# Patient Record
Sex: Male | Born: 1990 | Race: White | Hispanic: No | Marital: Single | Smoking: Current every day smoker
Health system: Southern US, Community
[De-identification: ages and names within clinical notes are randomized; demographics above are authoritative.]

## PROBLEM LIST (undated history)

## (undated) DIAGNOSIS — G8929 Other chronic pain: Secondary | ICD-10-CM

## (undated) DIAGNOSIS — J40 Bronchitis, not specified as acute or chronic: Secondary | ICD-10-CM

## (undated) DIAGNOSIS — M549 Dorsalgia, unspecified: Secondary | ICD-10-CM

## (undated) DIAGNOSIS — M51369 Other intervertebral disc degeneration, lumbar region without mention of lumbar back pain or lower extremity pain: Secondary | ICD-10-CM

## (undated) DIAGNOSIS — M5136 Other intervertebral disc degeneration, lumbar region: Secondary | ICD-10-CM

## (undated) DIAGNOSIS — J069 Acute upper respiratory infection, unspecified: Secondary | ICD-10-CM

## (undated) HISTORY — PX: EYE SURGERY: SHX253

---
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
## 2010-05-06 ENCOUNTER — Emergency Department (HOSPITAL_COMMUNITY)
Admission: EM | Admit: 2010-05-06 | Discharge: 2010-05-06 | Payer: Self-pay | Source: Home / Self Care | Admitting: Emergency Medicine

## 2010-07-18 ENCOUNTER — Emergency Department (HOSPITAL_COMMUNITY)
Admission: EM | Admit: 2010-07-18 | Discharge: 2010-07-18 | Disposition: A | Discharge status: Home / Self Care | Payer: Self-pay | Attending: Emergency Medicine | Admitting: Emergency Medicine

## 2010-07-18 DIAGNOSIS — R059 Cough, unspecified: Secondary | ICD-10-CM | POA: Insufficient documentation

## 2010-07-18 DIAGNOSIS — R05 Cough: Secondary | ICD-10-CM | POA: Insufficient documentation

## 2010-07-18 DIAGNOSIS — J069 Acute upper respiratory infection, unspecified: Secondary | ICD-10-CM | POA: Insufficient documentation

## 2010-07-18 DIAGNOSIS — J40 Bronchitis, not specified as acute or chronic: Secondary | ICD-10-CM | POA: Insufficient documentation
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
## 2010-09-24 NOTE — Op Note (Signed)
NAMEROEY, COOPMAN                            ACCOUNT NO.:  [id number]   MEDICAL RECORD NO.:  [id number]                   PATIENT TYPE:  INP   LOCATION:  5039                                 FACILITY:  [facility]   PHYSICIAN:  Blake Divine., M.D.              DATE OF BIRTH:  07-18-1990   DATE OF PROCEDURE:  08/08/2003  DATE OF DISCHARGE:                                 OPERATIVE REPORT   PREOPERATIVE DIAGNOSIS:  Ruptured globe, right eye.   POSTOPERATIVE DIAGNOSIS:  Ruptured globe, right eye.   PROCEDURE:  The patient was given IM Rocephin, was examined in the emergency  room and transported to the operating room where after induction of general  anesthesia, his face was prepped and draped in the usual sterile fashion  with the eye lashes of the right eye lid being trimmed.  It was noted that  the right eye lid had a deep laceration 1 mm from the limbus that did not  extent full-thickness and a more superficial laceration approximately 5 mm  on the right upper eye lid.  The patient's eye had a hyphema and there was  laceration of the temporal conjunctiva with brown tissue present.  A 6-0  silk suture was passed through the tarsus to retract the upper and lower  lids, keeping no pressure on the globe and then using the Bishop-Harmon  forceps and the Westcott scissors, a 300 degree peritomy was performed,  starting superiorly.  The eye was very soft, making the dissection  difficult.  The dissection was carried around to the temporal position at  the 9 o'clock position.  In this location, it was noted that there was a  scleral laceration that extended 5.5 mm temporally from the limbus.  A 6-0  Mersilene suture was placed and the wound edge was carefully debrided using  at [facility] sponge and Vannas scissors, being careful to retain as much tissue  as possible. Two specimens of uveal tissue were sent to pathology.  An iris  spatula was used to re-insert part of the uveal tissue that was  present.  After all uveal tissue was out of the eye, a total of four interrupted  Mersilene sutures on a spatula needle were placed to give complete closure  of the sclera.  Following this, a paracentesis track was formed through  clear cornea using a 5100 blade.  Provisc was injected in the eye to deepen  the anterior chamber.  After the Provisc had been injected in the eye, the  wound was again re-inspected.  There was an extension of the incision that  required an additional suture but the total sutures were as mentioned.  Following this, BSS was injected in the anterior chamber, the anterior  chamber remained deep.  The eye had a pressure that by palpation felt to be  in the mid teens.  Following this, a cryo unit was used  going down to a  minus 55 degrees, allowing the cryo to stay on the eye on a freeze soft  freeze over the area of vitreous and uveal prolapse that had been present.  Three cryo spots were placed.  Following this, a 6-0 Vicryl suture was used  to re-oppose the conjunctiva.  A subconjunctival injection of gentamycin was  given.  1 g of Ancef was given IV.  Following this, TobraDex ointment was  applied to the eye.  Following that, two sutures were used to close the skin  incisions using a 6-0 silk.  Following this, TobraDex was applied to the eye  and to the skin and a patch and fox shield were placed.  The patient was  then extubated and returned to the recovery area in stable condition.                                               Blake Divine., M.D.    RW/MEDQ  D:  08/08/2003  T:  08/08/2003  Job:  [id number]

## 2011-01-19 ENCOUNTER — Emergency Department (HOSPITAL_COMMUNITY)
Admission: EM | Admit: 2011-01-19 | Discharge: 2011-01-19 | Disposition: A | Discharge status: Home / Self Care | Payer: Self-pay | Attending: Emergency Medicine | Admitting: Emergency Medicine

## 2011-01-19 DIAGNOSIS — R110 Nausea: Secondary | ICD-10-CM | POA: Insufficient documentation

## 2011-01-19 DIAGNOSIS — B9789 Other viral agents as the cause of diseases classified elsewhere: Secondary | ICD-10-CM | POA: Insufficient documentation

## 2011-01-19 LAB — POCT I-STAT, CHEM 8
Calcium, Ion: 1.24 mmol/L (ref 1.12–1.32)
Chloride: 107 mEq/L (ref 96–112)
Glucose, Bld: 103 mg/dL — ABNORMAL HIGH (ref 70–99)
HCT: 46 % (ref 39.0–52.0)
Hemoglobin: 15.6 g/dL (ref 13.0–17.0)
Potassium: 4.4 mEq/L (ref 3.5–5.1)

## 2011-01-19 LAB — URINALYSIS, ROUTINE W REFLEX MICROSCOPIC
Bilirubin Urine: NEGATIVE
Hgb urine dipstick: NEGATIVE
Nitrite: NEGATIVE
Protein, ur: NEGATIVE mg/dL
Urobilinogen, UA: 0.2 mg/dL (ref 0.0–1.0)

## 2011-01-19 LAB — MONONUCLEOSIS SCREEN: Mono Screen: NEGATIVE

## 2011-01-28 ENCOUNTER — Emergency Department (HOSPITAL_COMMUNITY)
Admission: EM | Admit: 2011-01-28 | Discharge: 2011-01-29 | Discharge status: Against Medical Advice | Payer: Self-pay | Attending: Emergency Medicine | Admitting: Emergency Medicine

## 2011-03-14 ENCOUNTER — Emergency Department (HOSPITAL_COMMUNITY): Payer: Self-pay

## 2011-03-14 ENCOUNTER — Encounter: Payer: Self-pay | Admitting: *Deleted

## 2011-03-14 ENCOUNTER — Emergency Department (HOSPITAL_COMMUNITY)
Admission: EM | Admit: 2011-03-14 | Discharge: 2011-03-14 | Disposition: A | Discharge status: Home / Self Care | Payer: Self-pay | Attending: Emergency Medicine | Admitting: Emergency Medicine

## 2011-03-14 DIAGNOSIS — R011 Cardiac murmur, unspecified: Secondary | ICD-10-CM | POA: Insufficient documentation

## 2011-03-14 DIAGNOSIS — R05 Cough: Secondary | ICD-10-CM | POA: Insufficient documentation

## 2011-03-14 DIAGNOSIS — R059 Cough, unspecified: Secondary | ICD-10-CM | POA: Insufficient documentation

## 2011-03-14 DIAGNOSIS — J40 Bronchitis, not specified as acute or chronic: Secondary | ICD-10-CM | POA: Insufficient documentation

## 2011-03-14 DIAGNOSIS — R062 Wheezing: Secondary | ICD-10-CM | POA: Insufficient documentation

## 2011-03-14 DIAGNOSIS — R0602 Shortness of breath: Secondary | ICD-10-CM | POA: Insufficient documentation

## 2011-03-14 HISTORY — DX: Acute upper respiratory infection, unspecified: J06.9

## 2011-03-14 HISTORY — DX: Bronchitis, not specified as acute or chronic: J40

## 2011-03-14 MED ORDER — IPRATROPIUM BROMIDE 0.02 % IN SOLN
0.5000 mg | Freq: Once | RESPIRATORY_TRACT | Status: DC
  Filled 2011-03-14: qty 2.5

## 2011-03-14 MED ORDER — ALBUTEROL SULFATE HFA 108 (90 BASE) MCG/ACT IN AERS: 2.0000 | INHALATION_SPRAY | RESPIRATORY_TRACT | Status: DC | PRN

## 2011-03-14 MED ORDER — ALBUTEROL SULFATE (5 MG/ML) 0.5% IN NEBU
5.0000 mg | INHALATION_SOLUTION | Freq: Once | RESPIRATORY_TRACT | Status: DC
  Filled 2011-03-14: qty 1

## 2011-03-14 NOTE — ED Notes (Signed)
Productive cough over one week, sinus drainage.

## 2011-03-14 NOTE — ED Provider Notes (Signed)
History     CSN: [id number] Arrival date & time: 03/14/2011  8:01 AM   First MD Initiated Contact with Patient 03/14/11 [phone]      Chief Complaint  Patient presents with  . Cough    (Consider location/radiation/quality/duration/timing/severity/associated sxs/prior treatment) Patient is a 20 y.o. male presenting with cough. The history is provided by the patient. No language interpreter was used.  Cough This is a new problem. Episode onset: 1.5 weeks ago. The problem occurs every few minutes. The problem has been gradually worsening. The cough is productive of purulent sputum. There has been no fever. Associated symptoms include shortness of breath. Pertinent negatives include no chest pain, no chills, no sweats, no weight loss, no ear pain, no headaches, no rhinorrhea and no sore throat. Treatments tried: cough drops. The treatment provided mild relief. He is a smoker. His past medical history is significant for bronchitis. His past medical history does not include pneumonia or asthma.    Past Medical History  Diagnosis Date  . Bronchitis   . Upper respiratory infection, acute     Past Surgical History  Procedure Date  . Eye surgery     Family History  Problem Relation Age of Onset  . Asthma Mother   . Hypertension Mother   . Diabetes Other     History  Substance Use Topics  . Smoking status: Current Everyday Smoker -- 1.0 packs/day  . Smokeless tobacco: Not on file  . Alcohol Use: No      Review of Systems  Constitutional: Negative for fever, chills, weight loss, diaphoresis, appetite change and unexpected weight change.  HENT: Negative for ear pain, nosebleeds, congestion, sore throat, rhinorrhea, sneezing, neck pain and neck stiffness.   Eyes: Negative for pain.  Respiratory: Positive for cough and shortness of breath. Negative for chest tightness.   Cardiovascular: Negative for chest pain, palpitations and leg swelling.  Gastrointestinal: Negative for nausea,  vomiting, abdominal pain, diarrhea and constipation.  Genitourinary: Negative for dysuria and hematuria.  Musculoskeletal: Negative for back pain, joint swelling and gait problem.  Skin: Negative for color change, rash and wound.  Neurological: Negative for dizziness, weakness, numbness and headaches.  Hematological: Does not bruise/bleed easily.  Psychiatric/Behavioral: Negative for behavioral problems and confusion.    Allergies  Review of patient's allergies indicates no known allergies.  Home Medications  No current outpatient prescriptions on file.  BP 141/74  Pulse 81  Temp(Src) 97.6 F (36.4 C) (Oral)  Resp 18  SpO2 94%  BP 120/62  Pulse 62  Temp(Src) 97.4 F (36.3 C) (Oral)  Resp 19  SpO2 100%   Physical Exam  Constitutional: He is oriented to person, place, and time. He appears well-developed and well-nourished. No distress.  HENT:  Head: Normocephalic and atraumatic.  Right Ear: External ear normal.  Left Ear: External ear normal.  Mouth/Throat: No oropharyngeal exudate.  Eyes: Pupils are equal, round, and reactive to light.  Neck: Normal range of motion.  Cardiovascular: Normal rate and regular rhythm.  Exam reveals no gallop and no friction rub.   Murmur heard. Pulmonary/Chest: Effort normal. No accessory muscle usage. No respiratory distress. He has wheezes in the right upper field and the left upper field.  Abdominal: Soft. He exhibits no distension. There is no tenderness.  Musculoskeletal: Normal range of motion. He exhibits no edema and no tenderness.  Neurological: He is alert and oriented to person, place, and time. Coordination normal.  Skin: Skin is warm and dry. No rash noted.  Psychiatric: He has a normal mood and affect. His behavior is normal.    ED Course  Procedures (including critical care time)  Dg Chest 2 View  03/14/2011  *RADIOLOGY REPORT*  Clinical Data: Cough, wheezing  CHEST - 2 VIEW  Comparison: None.  Findings: Lungs are clear.  No pleural effusion or pneumothorax.  Cardiomediastinal silhouette is within normal limits.  Visualized osseous structures are within normal limits.  IMPRESSION: Normal chest radiographs.  Original Report Authenticated By: Charline Bills, M.D.     No diagnosis found.    MDM  20 y/o M with 1.5 wks productive cough. CXR shows no pneumonia. PERC negative. Wheezes improved after breathing treatment.         Elwyn Reach [place], [place] 03/14/11 1125

## 2011-03-14 NOTE — ED Provider Notes (Signed)
Medical screening examination/treatment/procedure(s) were performed by non-physician practitioner and as supervising physician I was immediately available for consultation/collaboration.   Lyanne Co, MD 03/14/11 [phone]

## 2011-03-14 NOTE — ED Notes (Signed)
RT called for neb tx.

## 2011-03-14 NOTE — ED Notes (Signed)
Pt is now breathing without difficulty,...nebs effective and no wheezing present at recheck.the patient states he feels much better...reviewed RX with him.Marland KitchenMarland Kitchen

## 2011-07-15 ENCOUNTER — Emergency Department (HOSPITAL_COMMUNITY)
Admission: EM | Admit: 2011-07-15 | Discharge: 2011-07-15 | Disposition: A | Discharge status: Home / Self Care | Payer: Self-pay | Attending: Emergency Medicine | Admitting: Emergency Medicine

## 2011-07-15 ENCOUNTER — Emergency Department (HOSPITAL_COMMUNITY): Payer: Self-pay

## 2011-07-15 ENCOUNTER — Encounter (HOSPITAL_COMMUNITY): Payer: Self-pay | Admitting: Emergency Medicine

## 2011-07-15 DIAGNOSIS — F17200 Nicotine dependence, unspecified, uncomplicated: Secondary | ICD-10-CM | POA: Insufficient documentation

## 2011-07-15 DIAGNOSIS — J209 Acute bronchitis, unspecified: Secondary | ICD-10-CM | POA: Insufficient documentation

## 2011-07-15 DIAGNOSIS — R0981 Nasal congestion: Secondary | ICD-10-CM

## 2011-07-15 DIAGNOSIS — R062 Wheezing: Secondary | ICD-10-CM | POA: Insufficient documentation

## 2011-07-15 DIAGNOSIS — R5381 Other malaise: Secondary | ICD-10-CM | POA: Insufficient documentation

## 2011-07-15 DIAGNOSIS — J3489 Other specified disorders of nose and nasal sinuses: Secondary | ICD-10-CM | POA: Insufficient documentation

## 2011-07-15 DIAGNOSIS — J069 Acute upper respiratory infection, unspecified: Secondary | ICD-10-CM | POA: Insufficient documentation

## 2011-07-15 DIAGNOSIS — R112 Nausea with vomiting, unspecified: Secondary | ICD-10-CM | POA: Insufficient documentation

## 2011-07-15 MED ORDER — PREDNISONE 20 MG PO TABS
60.0000 mg | ORAL_TABLET | Freq: Once | ORAL | Status: AC
  Administered 2011-07-15: 60 mg via ORAL
  Filled 2011-07-15: qty 3

## 2011-07-15 MED ORDER — OXYMETAZOLINE HCL 0.05 % NA SOLN: 2.0000 | Freq: Two times a day (BID) | NASAL | Status: AC

## 2011-07-15 MED ORDER — ALBUTEROL SULFATE (5 MG/ML) 0.5% IN NEBU
5.0000 mg | INHALATION_SOLUTION | Freq: Once | RESPIRATORY_TRACT | Status: AC
  Administered 2011-07-15: 5 mg via RESPIRATORY_TRACT
  Filled 2011-07-15: qty 1

## 2011-07-15 MED ORDER — MOMETASONE FUROATE 50 MCG/ACT NA SUSP: 2.0000 | Freq: Every day | NASAL | Status: DC

## 2011-07-15 MED ORDER — PREDNISONE 10 MG PO TABS: 20.0000 mg | ORAL_TABLET | Freq: Every day | ORAL | Status: DC

## 2011-07-15 NOTE — ED Provider Notes (Signed)
History     CSN: [id number]  Arrival date & time 07/15/11  0754   First MD Initiated Contact with Patient 07/15/11 [phone]      Chief Complaint  Patient presents with  . Nausea    For two days pt states he has had viral symptioms  . Emesis  . Cough    (Consider location/radiation/quality/duration/timing/severity/associated sxs/prior treatment) HPI Pt with 2 days of rhinorrhea, sinus pressure, sore throat, non-productive cough, wheezing and generalized fatigue. No fever, chills, N/V/D, abd pain, neck stiffness, focal weakness, sensory changes. Pt has several family members with similar sylmptoms.  Past Medical History  Diagnosis Date  . Bronchitis   . Upper respiratory infection, acute     Past Surgical History  Procedure Date  . Eye surgery     Family History  Problem Relation Age of Onset  . Asthma Mother   . Hypertension Mother   . Diabetes Other     History  Substance Use Topics  . Smoking status: Current Everyday Smoker -- 1.5 packs/day  . Smokeless tobacco: Not on file  . Alcohol Use: No      Review of Systems  Constitutional: Positive for fatigue. Negative for fever and chills.  HENT: Positive for congestion, sore throat, rhinorrhea and sinus pressure. Negative for ear pain, neck pain and neck stiffness.   Eyes: Negative for visual disturbance.  Respiratory: Positive for cough and wheezing. Negative for chest tightness and shortness of breath.   Cardiovascular: Negative for chest pain and palpitations.  Gastrointestinal: Negative for nausea, vomiting, abdominal pain and diarrhea.  Genitourinary: Negative for dysuria.  Neurological: Negative for dizziness, weakness, numbness and headaches.    Allergies  Review of patient's allergies indicates no known allergies.  Home Medications   Current Outpatient Rx  Name Route Sig Dispense Refill  . DIPHENHYDRAMINE HCL 25 MG PO CAPS Oral Take 25 mg by mouth every 6 (six) hours as needed. allergies    .  MOMETASONE FUROATE 50 MCG/ACT NA SUSP Nasal Place 2 sprays into the nose daily. 17 g 12  . OXYMETAZOLINE HCL 0.05 % NA SOLN Nasal Place 2 sprays into the nose 2 (two) times daily. 30 mL 0  . PREDNISONE 10 MG PO TABS Oral Take 2 tablets (20 mg total) by mouth daily. 15 tablet 0    BP 110/56  Pulse 60  Temp(Src) 98 F (36.7 C) (Oral)  Resp 16  Ht 6\' 2"  (1.88 m)  Wt 243 lb (110.224 kg)  BMI 31.20 kg/m2  SpO2 98%  Physical Exam  Nursing note and vitals reviewed. Constitutional: He is oriented to person, place, and time. He appears well-developed and well-nourished. No distress.  HENT:  Head: Normocephalic and atraumatic.  Mouth/Throat: Oropharynx is clear and moist. No oropharyngeal exudate.       Mild bl maxillary tenderness to percussion  Eyes: EOM are normal. Pupils are equal, round, and reactive to light.  Neck: Normal range of motion. Neck supple.       No nuchal rigidity  Cardiovascular: Normal rate and regular rhythm.   Pulmonary/Chest: Effort normal. No respiratory distress. He has wheezes (scattered mild wheezing). He has no rales.  Abdominal: Soft. Bowel sounds are normal. He exhibits no mass. There is no tenderness. There is no rebound and no guarding.  Musculoskeletal: Normal range of motion. He exhibits no edema and no tenderness.  Neurological: He is alert and oriented to person, place, and time.  Skin: Skin is warm and dry. No rash noted. No erythema.  Psychiatric: He has a normal mood and affect. His behavior is normal.    ED Course  Procedures (including critical care time)  Labs Reviewed - No data to display Dg Chest 2 View  07/15/2011  *RADIOLOGY REPORT*  Clinical Data: Cough and wheezing.  CHEST - 2 VIEW  Comparison: Two-view chest 03/14/2011.  Findings: The heart size is normal.  The lungs are clear.  The visualized soft tissues and bony thorax are unremarkable.  IMPRESSION: Negative chest.  Original Report Authenticated By: Jamesetta Orleans. MATTERN, M.D.      1. URI, acute   2. Sinus congestion   3. Bronchitis with bronchospasm       MDM   CXR reviewed by myself. No infiltrates or cardiopulmonary abnormalities. D/C home with short steroid course and nasal sprays to relieve sinus congestion. Cont to use albuterol inh as needed.        Loren Racer, MD 07/15/11 [phone]

## 2011-07-15 NOTE — Discharge Instructions (Signed)

## 2011-08-04 ENCOUNTER — Encounter (HOSPITAL_COMMUNITY): Payer: Self-pay | Admitting: *Deleted

## 2011-08-04 ENCOUNTER — Emergency Department (INDEPENDENT_AMBULATORY_CARE_PROVIDER_SITE_OTHER)
Admission: EM | Admit: 2011-08-04 | Discharge: 2011-08-04 | Disposition: A | Discharge status: Home / Self Care | Payer: Self-pay | Source: Home / Self Care

## 2011-08-04 ENCOUNTER — Emergency Department (HOSPITAL_COMMUNITY): Payer: Self-pay

## 2011-08-04 ENCOUNTER — Emergency Department (HOSPITAL_COMMUNITY)
Admission: EM | Admit: 2011-08-04 | Discharge: 2011-08-04 | Discharge status: Against Medical Advice | Payer: Self-pay | Attending: Emergency Medicine | Admitting: Emergency Medicine

## 2011-08-04 ENCOUNTER — Encounter (HOSPITAL_COMMUNITY): Payer: Self-pay | Admitting: Emergency Medicine

## 2011-08-04 ENCOUNTER — Emergency Department (INDEPENDENT_AMBULATORY_CARE_PROVIDER_SITE_OTHER): Payer: Self-pay

## 2011-08-04 DIAGNOSIS — M545 Low back pain, unspecified: Secondary | ICD-10-CM | POA: Insufficient documentation

## 2011-08-04 DIAGNOSIS — M79609 Pain in unspecified limb: Secondary | ICD-10-CM | POA: Insufficient documentation

## 2011-08-04 DIAGNOSIS — F17200 Nicotine dependence, unspecified, uncomplicated: Secondary | ICD-10-CM | POA: Insufficient documentation

## 2011-08-04 MED ORDER — KETOROLAC TROMETHAMINE 60 MG/2ML IM SOLN
60.0000 mg | Freq: Once | INTRAMUSCULAR | Status: DC
  Filled 2011-08-04: qty 2

## 2011-08-04 MED ORDER — METHOCARBAMOL 750 MG PO TABS: 750.0000 mg | ORAL_TABLET | Freq: Four times a day (QID) | ORAL | Status: AC

## 2011-08-04 MED ORDER — IBUPROFEN 800 MG PO TABS: 800.0000 mg | ORAL_TABLET | Freq: Three times a day (TID) | ORAL | Status: DC

## 2011-08-04 NOTE — ED Notes (Signed)
Pt walking around his room several times pacing the floor. Pt states his father is suppose to come back and pick him up and he did not want to wait. Pt states he understands risk of leaving ama and refused care
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
## 2011-08-04 NOTE — Discharge Instructions (Signed)
The xray of your lumbar spine was negative for fracture. Ice your back 3-4 times a day for 15-20 minutes. Take medications as prescribed. If your back pain persists next week, follow up with Dr Ermalene Searing.  Back Pain, Adult Back pain is very common. The pain often gets better over time. The cause of back pain is usually not dangerous. Most people can learn to manage their back pain on their own.  HOME CARE   Stay active. Start with short walks on flat ground if you can. Try to walk farther each day.   Do not sit, drive, or stand in one place for more than 30 minutes. Do not stay in bed.   Do not avoid exercise or work. Activity can help your back heal faster.   Be careful when you bend or lift an object. Bend at your knees, keep the object close to you, and do not twist.   Sleep on a firm mattress. Lie on your side, and bend your knees. If you lie on your back, put a pillow under your knees.   Only take medicines as told by your doctor.   Put ice on the injured area.   Put ice in a plastic bag.   Place a towel between your skin and the bag.   Leave the ice on for 15 to 20 minutes, 3 to 4 times a day for the first 2 to 3 days. After that, you can switch between ice and heat packs.   Ask your doctor about back exercises or massage.   Avoid feeling anxious or stressed. Find good ways to deal with stress, such as exercise.  GET HELP RIGHT AWAY IF:   Your pain does not go away with rest or medicine.   Your pain does not go away in 1 week.   You have new problems.   You do not feel well.   The pain spreads into your legs.   You cannot control when you poop (bowel movement) or pee (urinate).   Your arms or legs feel weak or lose feeling (numbness).   You feel sick to your stomach (nauseous) or throw up (vomit).   You have belly (abdominal) pain.   You feel like you may pass out (faint).  MAKE SURE YOU:   Understand these instructions.   Will watch your condition.    Will get help right away if you are not doing well or get worse.  Document Released: 10/12/2007 Document Revised: 04/14/2011 Document Reviewed: 09/13/2010 [employer] Patient Information 2012 [place], [place].

## 2011-08-08 NOTE — ED Provider Notes (Signed)
Medical screening examination/treatment/procedure(s) were performed by resident physician or non-physician practitioner and as supervising physician I was immediately available for consultation/collaboration.   Lakeem Rozo DOUGLAS MD.    Tyrisha Benninger D Makai Agostinelli, MD 08/08/11 1523 

## 2011-08-15 ENCOUNTER — Emergency Department (HOSPITAL_COMMUNITY)
Admission: EM | Admit: 2011-08-15 | Discharge: 2011-08-15 | Disposition: A | Discharge status: Home / Self Care | Payer: Self-pay | Attending: Emergency Medicine | Admitting: Emergency Medicine

## 2011-08-15 ENCOUNTER — Encounter (HOSPITAL_COMMUNITY): Payer: Self-pay | Admitting: *Deleted

## 2011-08-15 DIAGNOSIS — M79609 Pain in unspecified limb: Secondary | ICD-10-CM | POA: Insufficient documentation

## 2011-08-15 DIAGNOSIS — F17200 Nicotine dependence, unspecified, uncomplicated: Secondary | ICD-10-CM | POA: Insufficient documentation

## 2011-08-15 DIAGNOSIS — M25569 Pain in unspecified knee: Secondary | ICD-10-CM | POA: Insufficient documentation

## 2011-08-15 DIAGNOSIS — M25561 Pain in right knee: Secondary | ICD-10-CM

## 2011-08-15 MED ORDER — NAPROXEN 500 MG PO TABS: 500.0000 mg | ORAL_TABLET | Freq: Two times a day (BID) | ORAL | Status: DC

## 2011-08-15 MED ORDER — KETOROLAC TROMETHAMINE 60 MG/2ML IM SOLN
60.0000 mg | Freq: Once | INTRAMUSCULAR | Status: AC
Start: 2011-08-15 — End: 2011-08-15
  Administered 2011-08-15: 60 mg via INTRAMUSCULAR
  Filled 2011-08-15: qty 2

## 2011-08-15 NOTE — ED Notes (Signed)
Pt c/o R posterior knee pain x 5 years. Pt denies injury. Pt states "I'm sick of not sleeping. It keeps me up a lot." Pt has taken no medications for this pain.

## 2011-08-15 NOTE — ED Notes (Signed)
Patient is ready to be discharged- patient requested note for work, patient will be asst by friend . Denies pain . Patient states leg feels better

## 2011-08-15 NOTE — ED Provider Notes (Addendum)
History     CSN: [id number]  Arrival date & time 08/15/11  0335   First MD Initiated Contact with Patient 08/15/11 [phone]      Chief Complaint  Patient presents with  . Leg Pain    (Consider location/radiation/quality/duration/timing/severity/associated sxs/prior treatment) HPI Comments: 21 year old male with a history of approximately 5 years of right knee pain. He states this is almost daily, sharp, worse with standing, walking, bending. He has mild swelling behind the but no other redness or swelling in front of the knee. He denies injury and states that the pain is persistent and does not seem to be getting worse. He has been evaluated at urgent care recently and treated with Robaxin and ibuprofen for back pain and knee pain with some improvement  Patient is a 21 y.o. male presenting with leg pain. The history is provided by the patient and medical records.  Leg Pain     Past Medical History  Diagnosis Date  . Bronchitis   . Upper respiratory infection, acute     Past Surgical History  Procedure Date  . Eye surgery     Family History  Problem Relation Age of Onset  . Asthma Mother   . Hypertension Mother   . Diabetes Other     History  Substance Use Topics  . Smoking status: Current Everyday Smoker -- 1.5 packs/day  . Smokeless tobacco: Not on file  . Alcohol Use: No      Review of Systems  Constitutional: Negative for fever.  Gastrointestinal: Negative for nausea and vomiting.  Musculoskeletal: Positive for joint swelling.  Skin: Negative for rash.    Allergies  Review of patient's allergies indicates no known allergies.  Home Medications   Current Outpatient Rx  Name Route Sig Dispense Refill  . METHOCARBAMOL 750 MG PO TABS Oral Take 1 tablet (750 mg total) by mouth 4 (four) times daily. 20 tablet 0  . NAPROXEN 500 MG PO TABS Oral Take 1 tablet (500 mg total) by mouth 2 (two) times daily with a meal. 30 tablet 0    BP 139/72  Pulse 68  Temp(Src)  98.3 F (36.8 C) (Oral)  Resp 16  SpO2 97%  Physical Exam  Nursing note and vitals reviewed. Constitutional: He appears well-developed and well-nourished. No distress.  HENT:  Head: Normocephalic and atraumatic.  Eyes: Conjunctivae are normal. No scleral icterus.  Cardiovascular: Normal rate and regular rhythm.  Exam reveals no gallop and no friction rub.   No murmur heard. Pulmonary/Chest: Effort normal and breath sounds normal. No respiratory distress. He has no wheezes. He has no rales.  Musculoskeletal: He exhibits tenderness.       Slight decreased range of motion of the right knee secondary to mild pain in posterior popliteal fossa, mild fullness felt in the same area, left lower extremity with good range of motion.  Neurological: Coordination normal.  Skin: No rash noted.    ED Course  Procedures (including critical care time)  Labs Reviewed - No data to display No results found.   1. 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  No swelling of the lower extremity and ongoing pain for 5 years goes against venous thrombosis, no redness or swelling of the joint goes against septic arthritis, patient well-appearing without fever        Vida Roller, MD 08/15/11 [phone]  Arlys John  Alanda Amass, MD 08/15/11 [phone]

## 2011-08-15 NOTE — Discharge Instructions (Signed)
Knee Pain The knee is the complex joint between your thigh and your lower leg. It is made up of bones, tendons, ligaments, and cartilage. The bones that make up the knee are:  The femur in the thigh.   The tibia and fibula in the lower leg.   The patella or kneecap riding in the groove on the lower femur.  CAUSES  Knee pain is a common complaint with many causes. A few of these causes are:  Injury, such as:   A ruptured ligament or tendon injury.   Torn cartilage.   Medical conditions, such as:   Gout   Arthritis   Infections   Overuse, over training or overdoing a physical activity.  Knee pain can be minor or severe. Knee pain can accompany debilitating injury. Minor knee problems often respond well to self-care measures or get well on their own. More serious injuries may need medical intervention or even surgery. SYMPTOMS The knee is complex. Symptoms of knee problems can vary widely. Some of the problems are:  Pain with movement and weight bearing.   Swelling and tenderness.   Buckling of the knee.   Inability to straighten or extend your knee.   Your knee locks and you cannot straighten it.   Warmth and redness with pain and fever.   Deformity or dislocation of the kneecap.  DIAGNOSIS  Determining what is wrong may be very straight forward such as when there is an injury. It can also be challenging because of the complexity of the knee. Tests to make a diagnosis may include:  Your caregiver taking a history and doing a physical exam.   Routine X-rays can be used to rule out other problems. X-rays will not reveal a cartilage tear. Some injuries of the knee can be diagnosed by:   Arthroscopy a surgical technique by which a small video camera is inserted through tiny incisions on the sides of the knee. This procedure is used to examine and repair internal knee joint problems. Tiny instruments can be used during arthroscopy to repair the torn knee cartilage  (meniscus).   Arthrography is a radiology technique. A contrast liquid is directly injected into the knee joint. Internal structures of the knee joint then become visible on X-ray film.   An MRI scan is a non x-ray radiology procedure in which magnetic fields and a computer produce two- or three-dimensional images of the inside of the knee. Cartilage tears are often visible using an MRI scanner. MRI scans have largely replaced arthrography in diagnosing cartilage tears of the knee.   Blood work.   Examination of the fluid that helps to lubricate the knee joint (synovial fluid). This is done by taking a sample out using a needle and a syringe.  TREATMENT The treatment of knee problems depends on the cause. Some of these treatments are:  Depending on the injury, proper casting, splinting, surgery or physical therapy care will be needed.   Give yourself adequate recovery time. Do not overuse your joints. If you begin to get sore during workout routines, back off. Slow down or do fewer repetitions.   For repetitive activities such as cycling or running, maintain your strength and nutrition.   Alternate muscle groups. For example if you are a weight lifter, work the upper body on one day and the lower body the next.   Either tight or weak muscles do not give the proper support for your knee. Tight or weak muscles do not absorb the stress placed   on the knee joint. Keep the muscles surrounding the knee strong.   Take care of mechanical problems.   If you have flat feet, orthotics or special shoes may help. See your caregiver if you need help.   Arch supports, sometimes with wedges on the inner or outer aspect of the heel, can help. These can shift pressure away from the side of the knee most bothered by osteoarthritis.   A brace called an "unloader" brace also may be used to help ease the pressure on the most arthritic side of the knee.   If your caregiver has prescribed crutches, braces,  wraps or ice, use as directed. The acronym for this is PRICE. This means protection, rest, ice, compression and elevation.   Nonsteroidal anti-inflammatory drugs (NSAID's), can help relieve pain. But if taken immediately after an injury, they may actually increase swelling. Take NSAID's with food in your stomach. Stop them if you develop stomach problems. Do not take these if you have a history of ulcers, stomach pain or bleeding from the bowel. Do not take without your caregiver's approval if you have problems with fluid retention, heart failure, or kidney problems.   For ongoing knee problems, physical therapy may be helpful.   Glucosamine and chondroitin are over-the-counter dietary supplements. Both may help relieve the pain of osteoarthritis in the knee. These medicines are different from the usual anti-inflammatory drugs. Glucosamine may decrease the rate of cartilage destruction.   Injections of a corticosteroid drug into your knee joint may help reduce the symptoms of an arthritis flare-up. They may provide pain relief that lasts a few months. You may have to wait a few months between injections. The injections do have a small increased risk of infection, water retention and elevated blood sugar levels.   Hyaluronic acid injected into damaged joints may ease pain and provide lubrication. These injections may work by reducing inflammation. A series of shots may give relief for as long as 6 months.   Topical painkillers. Applying certain ointments to your skin may help relieve the pain and stiffness of osteoarthritis. Ask your pharmacist for suggestions. Many over the-counter products are approved for temporary relief of arthritis pain.   In some countries, doctors often prescribe topical NSAID's for relief of chronic conditions such as arthritis and tendinitis. A review of treatment with NSAID creams found that they worked as well as oral medications but without the serious side effects.    PREVENTION  Maintain a healthy weight. Extra pounds put more strain on your joints.   Get strong, stay limber. Weak muscles are a common cause of knee injuries. Stretching is important. Include flexibility exercises in your workouts.   Be smart about exercise. If you have osteoarthritis, chronic knee pain or recurring injuries, you may need to change the way you exercise. This does not mean you have to stop being active. If your knees ache after jogging or playing basketball, consider switching to swimming, water aerobics or other low-impact activities, at least for a few days a week. Sometimes limiting high-impact activities will provide relief.   Make sure your shoes fit well. Choose footwear that is right for your sport.   Protect your knees. Use the proper gear for knee-sensitive activities. Use kneepads when playing volleyball or laying carpet. Buckle your seat belt every time you drive. Most shattered kneecaps occur in car accidents.   Rest when you are tired.  SEEK MEDICAL CARE IF:  You have knee pain that is continual and does not   seem to be getting better.  SEEK IMMEDIATE MEDICAL CARE IF:  Your knee joint feels hot to the touch and you have a high fever. MAKE SURE YOU:   Understand these instructions.   Will watch your condition.   Will get help right away if you are not doing well or get worse.  Document Released: 02/20/2007 Document Revised: 04/14/2011 Document Reviewed: 02/20/2007 [employer] Patient Information 2012 [employer]. 

## 2011-08-16 NOTE — ED Provider Notes (Addendum)
History     CSN: [id number]  Arrival date & time 08/04/11  1028   First MD Initiated Contact with Patient 08/04/11 1028      Chief Complaint  Patient presents with  . Back Pain  . Leg Pain    (Consider location/radiation/quality/duration/timing/severity/associated sxs/prior treatment) HPI.... patient jumped out of dumpster injuring lower back a brief time ago. No radicular symptoms. Palpation makes it worse. Nothing makes it better. Severity is moderate. Described as sharp  Past Medical History  Diagnosis Date  . Bronchitis   . Upper respiratory infection, acute     Past Surgical History  Procedure Date  . Eye surgery     Family History  Problem Relation Age of Onset  . Asthma Mother   . Hypertension Mother   . Diabetes Other     History  Substance Use Topics  . Smoking status: Current Everyday Smoker -- 1.5 packs/day  . Smokeless tobacco: Not on file  . Alcohol Use: No      Review of Systems  All other systems reviewed and are negative.    Allergies  Review of patient's allergies indicates no known allergies.  Home Medications   Current Outpatient Rx  Name Route Sig Dispense Refill  . NAPROXEN 500 MG PO TABS Oral Take 1 tablet (500 mg total) by mouth 2 (two) times daily with a meal. 30 tablet 0    BP 118/69  Pulse 84  Temp(Src) 97.8 F (36.6 C) (Oral)  SpO2 94%  Physical Exam  Nursing note and vitals reviewed. Constitutional: He is oriented to person, place, and time. He appears well-developed and well-nourished.  HENT:  Head: Normocephalic and atraumatic.  Eyes: Conjunctivae and EOM are normal. Pupils are equal, round, and reactive to light.  Neck: Normal range of motion. Neck supple.  Cardiovascular: Normal rate and regular rhythm.   Pulmonary/Chest: Effort normal and breath sounds normal.  Abdominal: Soft. Bowel sounds are normal.  Musculoskeletal: Normal range of motion.       Minimal lumbar tenderness  Neurological: He is alert and  oriented to person, place, and time.  Skin: Skin is warm and dry.  Psychiatric: He has a normal mood and affect.    ED Course  Procedures (including critical care time)  Labs Reviewed - No data to display No results found.   No diagnosis found. No results found.   MDM  No bowel or bladder incontinence. No radicular symptoms. We will pain with straight leg raise. X-ray ordered        Donnetta Hutching, MD 08/16/11 1710  Donnetta Hutching, MD 08/16/11 [phone]

## 2012-07-14 ENCOUNTER — Emergency Department (HOSPITAL_COMMUNITY)
Admission: EM | Admit: 2012-07-14 | Discharge: 2012-07-15 | Discharge status: Against Medical Advice | Payer: Self-pay | Attending: Emergency Medicine | Admitting: Emergency Medicine

## 2012-07-14 DIAGNOSIS — R369 Urethral discharge, unspecified: Secondary | ICD-10-CM | POA: Insufficient documentation

## 2012-07-14 NOTE — ED Notes (Signed)
Pt c/o penile discharge for 2 weeks with increase in symptoms today and burning with urination.

## 2012-07-18 ENCOUNTER — Emergency Department (HOSPITAL_COMMUNITY)
Admission: EM | Admit: 2012-07-18 | Discharge: 2012-07-18 | Disposition: A | Discharge status: Home / Self Care | Payer: Self-pay | Attending: Emergency Medicine | Admitting: Emergency Medicine

## 2012-07-18 ENCOUNTER — Encounter (HOSPITAL_COMMUNITY): Payer: Self-pay | Admitting: *Deleted

## 2012-07-18 ENCOUNTER — Emergency Department (HOSPITAL_COMMUNITY): Payer: Self-pay

## 2012-07-18 DIAGNOSIS — R0602 Shortness of breath: Secondary | ICD-10-CM | POA: Insufficient documentation

## 2012-07-18 DIAGNOSIS — N342 Other urethritis: Secondary | ICD-10-CM | POA: Insufficient documentation

## 2012-07-18 DIAGNOSIS — L02419 Cutaneous abscess of limb, unspecified: Secondary | ICD-10-CM | POA: Insufficient documentation

## 2012-07-18 DIAGNOSIS — J209 Acute bronchitis, unspecified: Secondary | ICD-10-CM | POA: Insufficient documentation

## 2012-07-18 DIAGNOSIS — R300 Dysuria: Secondary | ICD-10-CM | POA: Insufficient documentation

## 2012-07-18 DIAGNOSIS — F17200 Nicotine dependence, unspecified, uncomplicated: Secondary | ICD-10-CM | POA: Insufficient documentation

## 2012-07-18 DIAGNOSIS — J069 Acute upper respiratory infection, unspecified: Secondary | ICD-10-CM | POA: Insufficient documentation

## 2012-07-18 DIAGNOSIS — R059 Cough, unspecified: Secondary | ICD-10-CM | POA: Insufficient documentation

## 2012-07-18 DIAGNOSIS — J3489 Other specified disorders of nose and nasal sinuses: Secondary | ICD-10-CM | POA: Insufficient documentation

## 2012-07-18 DIAGNOSIS — R369 Urethral discharge, unspecified: Secondary | ICD-10-CM | POA: Insufficient documentation

## 2012-07-18 DIAGNOSIS — R05 Cough: Secondary | ICD-10-CM | POA: Insufficient documentation

## 2012-07-18 DIAGNOSIS — L0291 Cutaneous abscess, unspecified: Secondary | ICD-10-CM

## 2012-07-18 DIAGNOSIS — L03119 Cellulitis of unspecified part of limb: Secondary | ICD-10-CM | POA: Insufficient documentation

## 2012-07-18 LAB — URINALYSIS, ROUTINE W REFLEX MICROSCOPIC
Bilirubin Urine: NEGATIVE
Protein, ur: 100 mg/dL — AB
Urobilinogen, UA: 1 mg/dL (ref 0.0–1.0)

## 2012-07-18 LAB — URINE MICROSCOPIC-ADD ON

## 2012-07-18 MED ORDER — DOXYCYCLINE HYCLATE 100 MG PO TABS
100.0000 mg | ORAL_TABLET | Freq: Once | ORAL | Status: AC
  Administered 2012-07-18: 100 mg via ORAL
  Filled 2012-07-18: qty 1

## 2012-07-18 MED ORDER — DOXYCYCLINE HYCLATE 100 MG PO CAPS: 100.0000 mg | ORAL_CAPSULE | Freq: Two times a day (BID) | ORAL | Status: DC

## 2012-07-18 MED ORDER — LIDOCAINE-EPINEPHRINE 2 %-1:100000 IJ SOLN
20.0000 mL | Freq: Once | INTRAMUSCULAR | Status: AC
  Administered 2012-07-18: 20 mL via INTRADERMAL

## 2012-07-18 MED ORDER — CEFTRIAXONE SODIUM 250 MG IJ SOLR
250.0000 mg | Freq: Once | INTRAMUSCULAR | Status: AC
  Administered 2012-07-18: 250 mg via INTRAMUSCULAR
  Filled 2012-07-18: qty 250

## 2012-07-18 MED ORDER — ALBUTEROL SULFATE HFA 108 (90 BASE) MCG/ACT IN AERS
2.0000 | INHALATION_SPRAY | Freq: Once | RESPIRATORY_TRACT | Status: AC
  Administered 2012-07-18: 2 via RESPIRATORY_TRACT
  Filled 2012-07-18: qty 6.7

## 2012-07-18 NOTE — ED Notes (Signed)
md at bedside

## 2012-07-18 NOTE — ED Provider Notes (Signed)
History     CSN: [id number]  Arrival date & time 07/18/12  5621   First MD Initiated Contact with Patient 07/18/12 0830      Chief Complaint  Patient presents with  . Otalgia  . Cough  . Penile Discharge    (Consider location/radiation/quality/duration/timing/severity/associated sxs/prior treatment) Patient is a 22 y.o. male presenting with ear pain, cough, and penile discharge. The history is provided by the patient. No language interpreter was used.  Otalgia Location:  Right Behind ear:  No abnormality Quality:  Dull and sore Severity:  Moderate Onset quality:  Unable to specify Duration:  12 hours Timing:  Intermittent Progression:  Waxing and waning Chronicity:  New Context: not direct blow, not elevation change, not foreign body in ear and not loud noise   Relieved by:  Nothing Worsened by:  Coughing and swallowing (yawning) Associated symptoms: congestion, cough and rhinorrhea   Associated symptoms: no abdominal pain, no diarrhea, no ear discharge, no fever, no headaches, no hearing loss, no neck pain, no sore throat, no tinnitus and no vomiting   Cough Associated symptoms: ear pain, rhinorrhea and shortness of breath   Associated symptoms: no chest pain, no chills, no fever, no headaches, no myalgias, no sore throat and no wheezing   Penile Discharge This is a new problem. The current episode started more than 2 days ago. The problem has been gradually worsening. Associated symptoms include shortness of breath. Pertinent negatives include no chest pain, no abdominal pain and no headaches. Nothing aggravates the symptoms. Nothing relieves the symptoms. He has tried nothing for the symptoms.    Past Medical History  Diagnosis Date  . Bronchitis   . Upper respiratory infection, acute     Past Surgical History  Procedure Laterality Date  . Eye surgery      Family History  Problem Relation Age of Onset  . Asthma Mother   . Hypertension Mother   . Diabetes  Other     History  Substance Use Topics  . Smoking status: Current Every Day Smoker -- 1.50 packs/day  . Smokeless tobacco: Not on file  . Alcohol Use: No      Review of Systems  Constitutional: Negative for fever, chills, activity change, appetite change and fatigue.  HENT: Positive for ear pain, congestion and rhinorrhea. Negative for hearing loss, nosebleeds, sore throat, facial swelling, trouble swallowing, neck pain, neck stiffness, voice change, postnasal drip, sinus pressure, tinnitus and ear discharge.   Eyes: Negative for redness and visual disturbance.  Respiratory: Positive for cough and shortness of breath. Negative for chest tightness and wheezing.   Cardiovascular: Negative for chest pain, palpitations and leg swelling.  Gastrointestinal: Negative for nausea, vomiting, abdominal pain and diarrhea.  Endocrine: Negative for polyuria.  Genitourinary: Positive for dysuria and discharge. Negative for urgency, frequency, hematuria, flank pain, decreased urine volume, penile swelling, scrotal swelling, genital sores, penile pain and testicular pain.  Musculoskeletal: Negative for myalgias, back pain and arthralgias.  Skin: Positive for color change (left thigh).  Neurological: Negative.  Negative for headaches.  Hematological: Negative.   Psychiatric/Behavioral: Negative.     Allergies  Review of patient's allergies indicates no known allergies.  Home Medications   Current Outpatient Rx  Name  Route  Sig  Dispense  Refill  . DM-Phenylephrine-Acetaminophen (TYLENOL COLD MULTI-SYMPTOM) 10-5-325 MG/15ML LIQD   Oral   Take 30 mLs by mouth every 6 (six) hours as needed.           BP 138/71  Pulse 63  Temp(Src) 98.4 F (36.9 C) (Oral)  Resp 22  SpO2 98%  Physical Exam  Nursing note and vitals reviewed. Constitutional: He appears well-developed and well-nourished.  Non-toxic appearance. He does not have a sickly appearance. He does not appear ill. No distress.    HENT:  Head: Normocephalic and atraumatic. No trismus in the jaw.  Right Ear: Hearing, tympanic membrane, external ear and ear canal normal. No tenderness. No mastoid tenderness. Tympanic membrane is not erythematous and not retracted. No middle ear effusion. No hemotympanum.  Left Ear: Hearing, tympanic membrane, external ear and ear canal normal. No tenderness. No mastoid tenderness. Tympanic membrane is not erythematous and not retracted.  No middle ear effusion. No hemotympanum.  Nose: Nose normal. No rhinorrhea, nose lacerations or sinus tenderness. No epistaxis.  Mouth/Throat: Uvula is midline, oropharynx is clear and moist and mucous membranes are normal. Mucous membranes are not pale, not dry and not cyanotic. No oral lesions. Normal dentition. No dental caries. No oropharyngeal exudate, posterior oropharyngeal edema, posterior oropharyngeal erythema or tonsillar abscesses.  Eyes: Conjunctivae are normal. Pupils are equal, round, and reactive to light. Right eye exhibits no discharge. Left eye exhibits no discharge. No scleral icterus.  Neck: Normal range of motion. Neck supple. No JVD present. No tracheal deviation present.  Cardiovascular: Normal rate, regular rhythm, normal heart sounds and intact distal pulses.  Exam reveals no gallop and no friction rub.   No murmur heard. Pulmonary/Chest: Effort normal. No stridor. No respiratory distress. He has wheezes. He has no rales. He exhibits no tenderness.  Abdominal: Soft. Bowel sounds are normal. He exhibits no distension and no mass. There is no tenderness. There is no rebound and no guarding.  Genitourinary: Testes normal.    Circumcised. No phimosis, paraphimosis, hypospadias, penile erythema or penile tenderness. Discharge found.  Note erythema and a fluctuant area on the left upper thigh.     Musculoskeletal: Normal range of motion. He exhibits no edema and no tenderness.  Lymphadenopathy:    He has no cervical adenopathy.   Neurological: He is alert.  Skin: Skin is warm and dry. No rash noted. He is not diaphoretic. No erythema. No pallor.  Psychiatric: He has a normal mood and affect. His behavior is normal.    ED Course  INCISION AND DRAINAGE Date/Time: 07/18/2012 11:17 AM Performed by: Dana Allan T Authorized by: Dana Allan T Consent: Verbal consent obtained. Risks and benefits: risks, benefits and alternatives were discussed Consent given by: patient Site marked: the operative site was marked Required items: required blood products, implants, devices, and special equipment available Patient identity confirmed: verbally with patient and arm band Type: abscess (left thigh) Body area: lower extremity Location details: left leg Anesthesia: local infiltration Local anesthetic: lidocaine 2% with epinephrine Anesthetic total: 2.5 ml Patient sedated: no Scalpel size: 11 Needle gauge: 22 Incision type: elliptical Complexity: simple Drainage: bloody Drainage amount: scant Wound treatment: wound left open Patient tolerance: Patient tolerated the procedure well with no immediate complications.   (including critical care time)  Labs Reviewed  GC/CHLAMYDIA PROBE AMP  URINALYSIS, ROUTINE W REFLEX MICROSCOPIC   Dg Chest 2 View  07/18/2012  *RADIOLOGY REPORT*  Clinical Data: Congestion, productive cough  CHEST - 2 VIEW  Comparison: Chest radiograph 07/15/2011  Findings: Normal mediastinum and cardiac silhouette.  Normal pulmonary  vasculature.  No evidence of effusion, infiltrate, or pneumothorax.  No acute bony abnormality.  IMPRESSION: Normal chest radiograph.   Original Report Authenticated By: Genevive Bi, M.D.  No diagnosis found.    MDM  Pt presents for evaluation of a cough, earache, and penile discharge.  He appears nontoxic, note stable VS, NAD.  He has a thick greenish/white penile discharge on exam and has been participating in unprotected sexual intercourse.  There is also  faint expiratory wheezing.  Will treat for GC/Chlamydia and acute bronchitis.  There is an incidental finding of an abscess on his left upper thigh.  Will perform a bedside incision and drainage.  1115.  Pt stable, NAD.  Tolerated I&D.  Cough improved s/p albuterol.  Exam consistent with a viral syndrome and acute bronchitis. Pt also has urethritis.  Administered IM ceftriaxone.  Will prescribe po doxycycline also.  Encouraged smoking cessation.  Also encouraged him to have his sexual partner or partners seek care also.      Tobin Chad, MD 07/18/12 1122

## 2012-07-18 NOTE — ED Notes (Signed)
Pt alert and oriented x4. Respirations even and unlabored, bilateral symmetrical rise and fall of chest. Skin warm and dry. In no acute distress. Denies needs.   

## 2012-07-18 NOTE — ED Notes (Signed)
Pt reports productive cough x3 days, now with R ear pain. Also reports penile discharge for a few weeks. Burning with urination.

## 2012-07-19 ENCOUNTER — Encounter: Payer: Self-pay | Admitting: *Deleted

## 2012-07-19 LAB — URINE CULTURE

## 2012-07-21 ENCOUNTER — Telehealth (HOSPITAL_COMMUNITY): Payer: Self-pay | Admitting: Emergency Medicine

## 2012-07-21 NOTE — ED Notes (Signed)
+  Gonorrhea. Patient treated with Rocephin and Doxycycline. Per protocol MD. [facility] faxed.

## 2012-07-21 NOTE — ED Notes (Signed)
Patient has +Gonorrhea. Checking to see if appropriately treated. °

## 2012-07-22 ENCOUNTER — Telehealth (HOSPITAL_COMMUNITY): Payer: Self-pay | Admitting: Emergency Medicine

## 2012-07-26 ENCOUNTER — Telehealth (HOSPITAL_COMMUNITY): Payer: Self-pay | Admitting: *Deleted

## 2012-07-26 NOTE — ED Notes (Signed)
Patient informed of positive results after id'd x 2 and informed of need to notify partner to be treated. 

## 2012-07-26 NOTE — ED Notes (Signed)
Patient treated with Rocephin and Zithromax. [facility] faxed. °

## 2013-02-08 ENCOUNTER — Encounter (HOSPITAL_COMMUNITY): Payer: Self-pay | Admitting: *Deleted

## 2013-02-08 ENCOUNTER — Emergency Department (HOSPITAL_COMMUNITY)
Admission: EM | Admit: 2013-02-08 | Discharge: 2013-02-08 | Disposition: A | Discharge status: Home / Self Care | Payer: Self-pay | Attending: Emergency Medicine | Admitting: Emergency Medicine

## 2013-02-08 ENCOUNTER — Emergency Department (HOSPITAL_COMMUNITY): Payer: Self-pay

## 2013-02-08 DIAGNOSIS — J9801 Acute bronchospasm: Secondary | ICD-10-CM | POA: Insufficient documentation

## 2013-02-08 DIAGNOSIS — F17200 Nicotine dependence, unspecified, uncomplicated: Secondary | ICD-10-CM | POA: Insufficient documentation

## 2013-02-08 DIAGNOSIS — J069 Acute upper respiratory infection, unspecified: Secondary | ICD-10-CM | POA: Insufficient documentation

## 2013-02-08 MED ORDER — ALBUTEROL SULFATE HFA 108 (90 BASE) MCG/ACT IN AERS: 2.0000 | INHALATION_SPRAY | RESPIRATORY_TRACT | Status: DC | PRN

## 2013-02-08 MED ORDER — PREDNISONE 20 MG PO TABS: ORAL_TABLET | ORAL | Status: DC

## 2013-02-08 NOTE — ED Provider Notes (Signed)
CSN: [id number]     Arrival date & time 02/08/13  2232 History   None    Chief Complaint  Patient presents with  . Cough  . Shortness of Breath   (Consider location/radiation/quality/duration/timing/severity/associated sxs/prior Treatment) HPI This 22 year old male is a smoker and was told to discontinue his one-month history of a nonproductive cough and 3 days of increased cough with some mild shortness breath and wheezing intermittently, he is not short of breath at the current time, he has had some nasal congestion for the last few days as well but no fever no confusion no rash no vomiting no diarrhea no chest pain no abdominal pain and no treatment prior to arrival. Past Medical History  Diagnosis Date  . Bronchitis   . Upper respiratory infection, acute    Past Surgical History  Procedure Laterality Date  . Eye surgery     Family History  Problem Relation Age of Onset  . Asthma Mother   . Hypertension Mother   . Diabetes Other    History  Substance Use Topics  . Smoking status: Current Every Day Smoker -- 1.50 packs/day  . Smokeless tobacco: Not on file  . Alcohol Use: No    Review of Systems 10 Systems reviewed and are negative for acute change except as noted in the HPI. Allergies  Review of patient's allergies indicates no known allergies.  Home Medications   Current Outpatient Rx  Name  Route  Sig  Dispense  Refill  . albuterol (PROVENTIL HFA;VENTOLIN HFA) 108 (90 BASE) MCG/ACT inhaler   Inhalation   Inhale 2 puffs into the lungs every 2 (two) hours as needed for wheezing or shortness of breath (cough).   1 Inhaler   0   . predniSONE (DELTASONE) 20 MG tablet      3 tabs po day one, then 2 po daily x 4 days   11 tablet   0    BP 140/62  Temp(Src) 98.3 F (36.8 C) (Oral)  SpO2 93% Physical Exam  Nursing note and vitals reviewed. Constitutional:  Awake, alert, nontoxic appearance.  HENT:  Head: Atraumatic.  Mouth/Throat: Oropharynx is clear and  moist. No oropharyngeal exudate.  Tympanic membranes clear bilaterally  Eyes: Right eye exhibits no discharge. Left eye exhibits no discharge.  Neck: Neck supple.  Cardiovascular: Normal rate and regular rhythm.   No murmur heard. Pulmonary/Chest: Effort normal. No respiratory distress. He has wheezes. He has no rales. He exhibits no tenderness.  Patient has mild diffuse expiratory wheezes with forced exhalation with normal speech with pulse oximetry normal on room air 93% no retractions no accessory muscle usage no crackles and no shortness of breath patient does not want breathing treatment while he is in the ED  Abdominal: Soft. Bowel sounds are normal. He exhibits no distension. There is no tenderness. There is no rebound and no guarding.  Musculoskeletal: He exhibits no tenderness.  Baseline ROM, no obvious new focal weakness.  Neurological:  Mental status and motor strength appears baseline for patient and situation.  Skin: No rash noted.  Psychiatric: He has a normal mood and affect.    ED Course  Procedures (including critical care time) Patient / Family / Caregiver informed of clinical course, understand medical decision-making process, and agree with plan. Labs Review Labs Reviewed - No data to display Imaging Review Dg Chest 2 View  02/08/2013   CLINICAL DATA:  Cough for 1 month, chest congestion for 3 days  EXAM: CHEST  2 VIEW  COMPARISON:  07/18/2012  FINDINGS: The heart size and mediastinal contours are within normal limits. Both lungs are clear. The visualized skeletal structures are unremarkable.  IMPRESSION: No active cardiopulmonary disease.   Electronically Signed   By: Christiana Pellant M.D.   On: 02/08/2013 23:21    MDM   1. Acute bronchospasm   2. URI (upper respiratory infection)    I doubt any other EMC precluding discharge at this time including, but not necessarily limited to the following:SBI.    Hurman Horn, MD 02/09/13 [phone]

## 2013-02-08 NOTE — ED Notes (Signed)
Patient states that he had a cough for the past month and has began to have head and chest congestion over the past 3 days. He denies fever.

## 2013-02-08 NOTE — ED Notes (Signed)
Mask provided at nurse first desk

## 2013-03-12 IMAGING — CR DG CHEST 2V
2 series · 2 of 2 positions shown · non-contrast
Comparison: None.

CLINICAL DATA: Cough, wheezing

CHEST - 2 VIEW

[w chest pa]
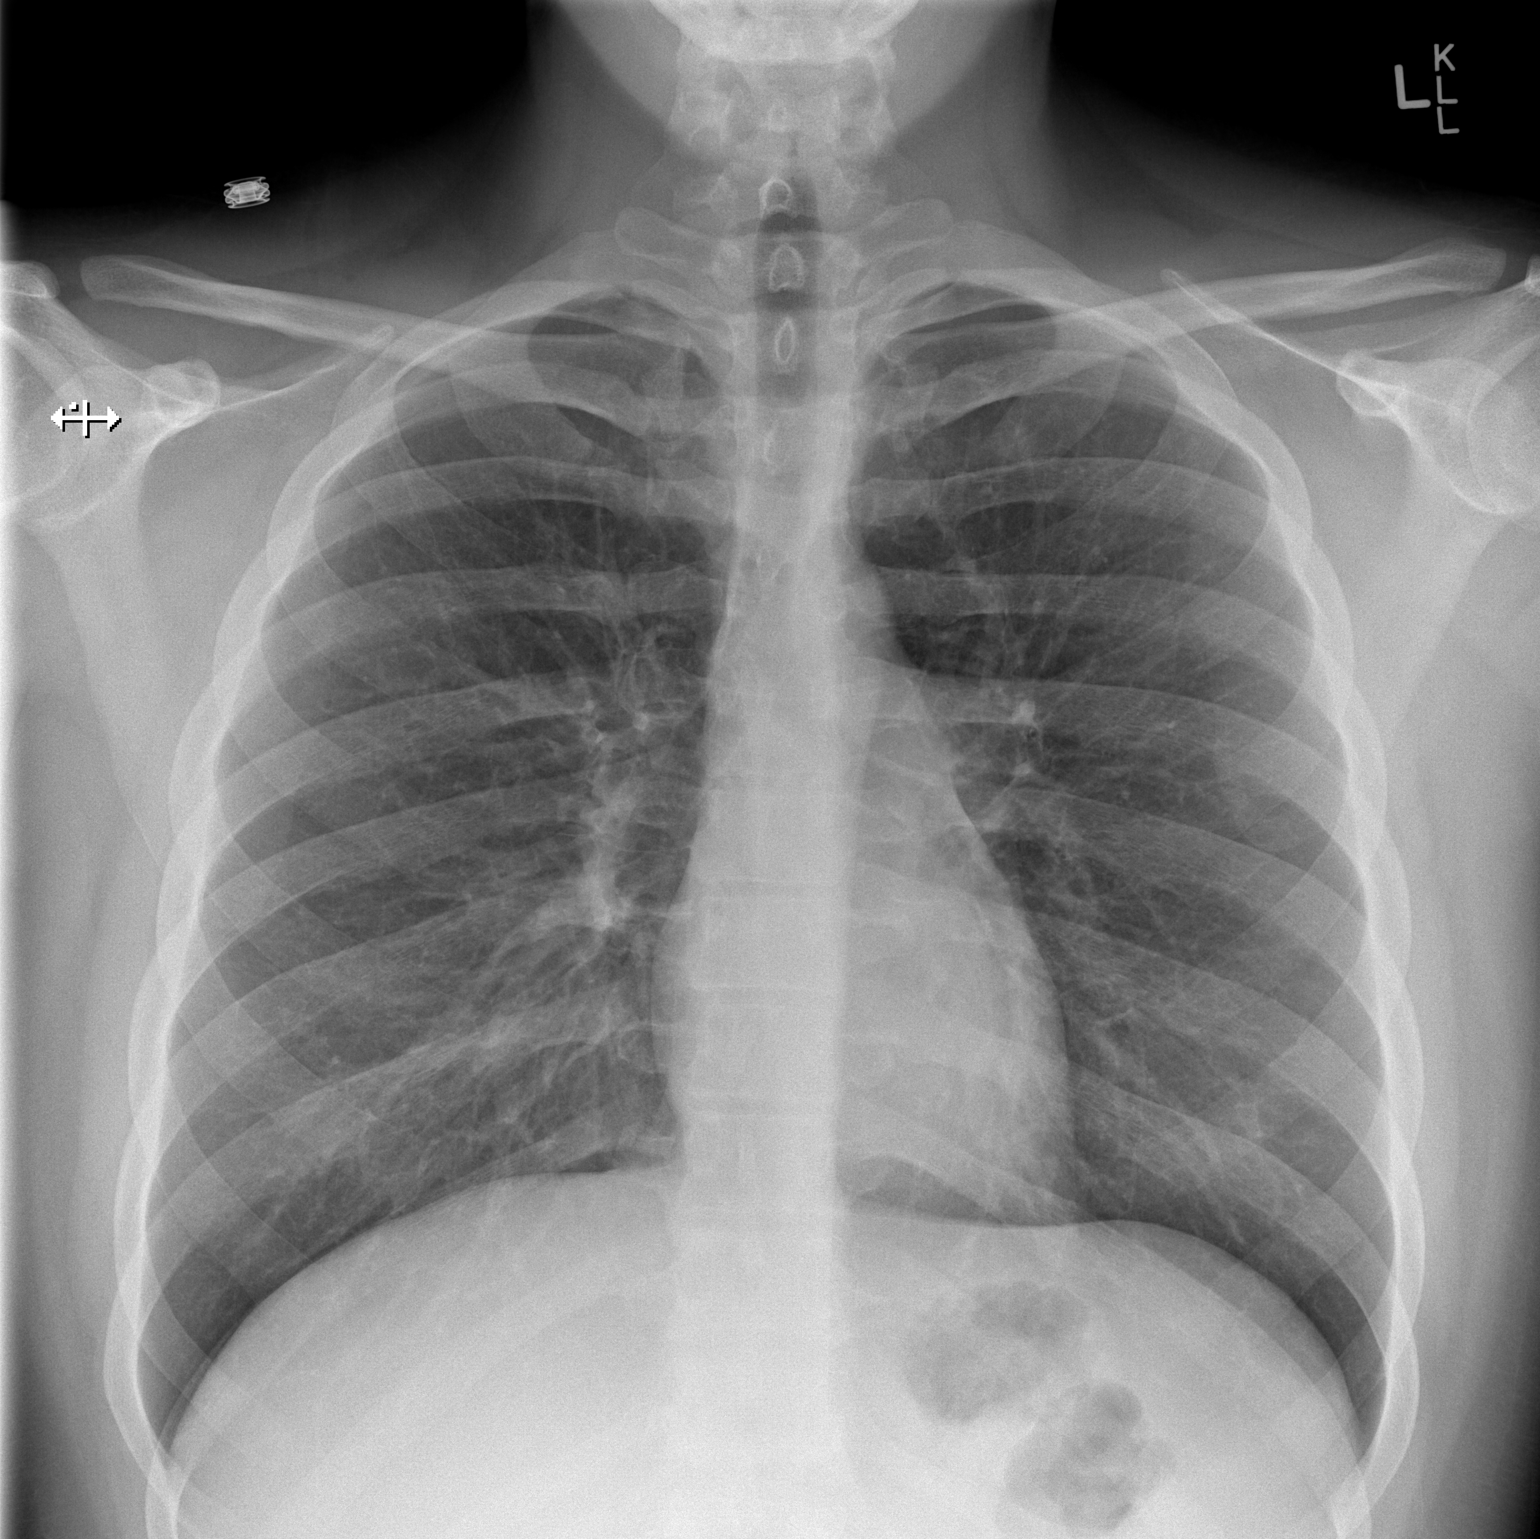

[w chest lat]
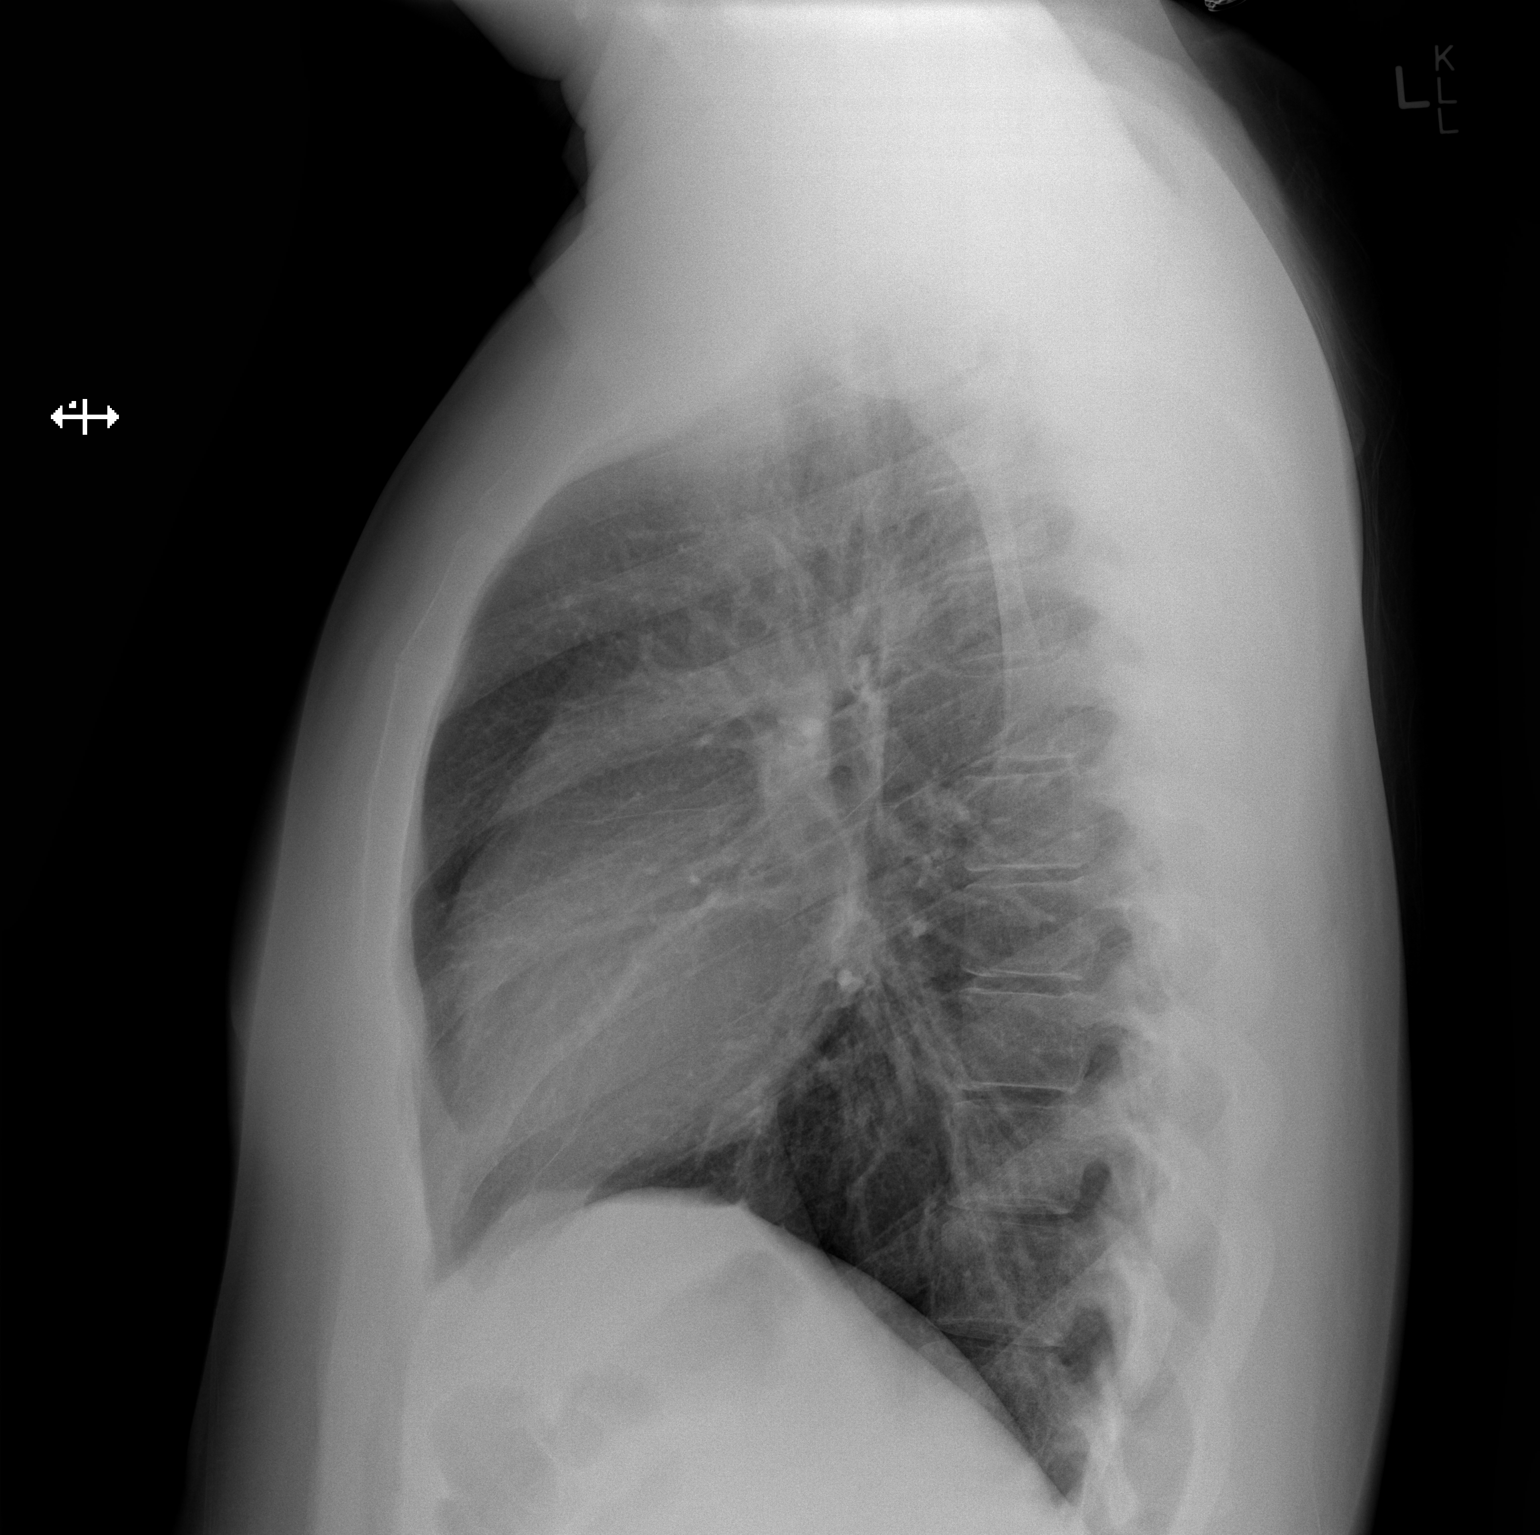

[2 of 2 positions shown; findings below may reference images not displayed]

FINDINGS: Lungs are clear. No pleural effusion or pneumothorax.

Cardiomediastinal silhouette is within normal limits.

Visualized osseous structures are within normal limits.
IMPRESSION: Normal chest radiographs.

## 2013-05-25 ENCOUNTER — Encounter (HOSPITAL_COMMUNITY): Payer: Self-pay | Admitting: Emergency Medicine

## 2013-05-25 ENCOUNTER — Emergency Department (HOSPITAL_COMMUNITY)
Admission: EM | Admit: 2013-05-25 | Discharge: 2013-05-25 | Disposition: A | Discharge status: Home / Self Care | Payer: Self-pay | Attending: Emergency Medicine | Admitting: Emergency Medicine

## 2013-05-25 DIAGNOSIS — IMO0002 Reserved for concepts with insufficient information to code with codable children: Secondary | ICD-10-CM | POA: Insufficient documentation

## 2013-05-25 DIAGNOSIS — Z79899 Other long term (current) drug therapy: Secondary | ICD-10-CM | POA: Insufficient documentation

## 2013-05-25 DIAGNOSIS — F17200 Nicotine dependence, unspecified, uncomplicated: Secondary | ICD-10-CM | POA: Insufficient documentation

## 2013-05-25 DIAGNOSIS — J069 Acute upper respiratory infection, unspecified: Secondary | ICD-10-CM | POA: Insufficient documentation

## 2013-05-25 MED ORDER — HYDROCODONE-HOMATROPINE 5-1.5 MG/5ML PO SYRP: 5.0000 mL | ORAL_SOLUTION | Freq: Four times a day (QID) | ORAL | Status: DC | PRN

## 2013-05-25 MED ORDER — GUAIFENESIN 100 MG/5ML PO LIQD: 100.0000 mg | ORAL | Status: DC | PRN

## 2013-05-25 NOTE — ED Provider Notes (Signed)
CSN: [id number]     Arrival date & time 05/25/13  1848 History  This chart was scribed for non-physician practitioner, Fayrene Helper, PA-C,working with Audree Camel, MD, by Karle Plumber, ED Scribe.  This patient was seen in room WTR7/WTR7 and the patient's care was started at 7:46 PM.  Chief Complaint  Patient presents with  . Cough   The history is provided by the patient. No language interpreter was used.   HPI Comments:  Terry Bailey is a 23 y.o. male who presents to the Emergency Department complaining of a cough for the past week. Pt states he has only been producing small amounts of mucus but reports upon waking this morning he coughed up a large amount of mucus and a small amount of blood and had lost his voice, which has since resolved. He states he has been experiencing intermittent rhinorrhea and intermittent periods of feeling hot. Pt denies taking anything for his symptoms since they started. He denies fever, chills, nausea, vomiting, diarrhea, or rash. Pt reports smoking 1.5-2.5 PPD.   Past Medical History  Diagnosis Date  . Bronchitis   . Upper respiratory infection, acute    Past Surgical History  Procedure Laterality Date  . Eye surgery     Family History  Problem Relation Age of Onset  . Asthma Mother   . Hypertension Mother   . Diabetes Other    History  Substance Use Topics  . Smoking status: Current Every Day Smoker -- 1.50 packs/day  . Smokeless tobacco: Not on file  . Alcohol Use: No    Review of Systems  Constitutional: Negative for fever and chills.  HENT: Positive for rhinorrhea.   Respiratory: Positive for cough.   Gastrointestinal: Negative for nausea, vomiting and diarrhea.  Skin: Negative for rash.    Allergies  Review of patient's allergies indicates no known allergies.  Home Medications   Current Outpatient Rx  Name  Route  Sig  Dispense  Refill  . albuterol (PROVENTIL HFA;VENTOLIN HFA) 108 (90 BASE) MCG/ACT inhaler   Inhalation  Inhale 2 puffs into the lungs every 2 (two) hours as needed for wheezing or shortness of breath (cough).   1 Inhaler   0   . predniSONE (DELTASONE) 20 MG tablet      3 tabs po day one, then 2 po daily x 4 days   11 tablet   0    Triage Vitals: BP 125/55  Pulse 72  Temp(Src) 98.7 F (37.1 C) (Oral)  Resp 16  SpO2 99% Physical Exam  Nursing note and vitals reviewed. Constitutional: He is oriented to person, place, and time. He appears well-developed and well-nourished.  HENT:  Head: Normocephalic and atraumatic.  Eyes: EOM are normal.  Neck: Normal range of motion.  Cardiovascular: Normal rate, regular rhythm and normal heart sounds.   Pulmonary/Chest: Effort normal and breath sounds normal. No respiratory distress. He has no wheezes. He has no rales. He exhibits no tenderness.  Musculoskeletal: Normal range of motion. He exhibits no edema and no tenderness.  Neurological: He is alert and oriented to person, place, and time.  Skin: Skin is warm and dry. No rash noted.  Psychiatric: He has a normal mood and affect. His behavior is normal.    ED Course  Procedures (including critical care time) DIAGNOSTIC STUDIES: Oxygen Saturation is 99% on RA, normal by my interpretation.   COORDINATION OF CARE: 7:51 PM- Will prescribe a cough medication. Pt verbalizes understanding and agrees to plan.  Medications - No data to display  Labs Review Labs Reviewed - No data to display Imaging Review No results found.  EKG Interpretation   None       MDM   1. URI (upper respiratory infection)    BP 125/55  Pulse 72  Temp(Src) 98.7 F (37.1 C) (Oral)  Resp 16  SpO2 99%   I personally performed the services described in this documentation, which was scribed in my presence. The recorded information has been reviewed and is accurate.    Fayrene HelperBowie Haeli Gerlich, PA-C 05/26/13 1004

## 2013-05-25 NOTE — ED Notes (Signed)
Patient states that he has had a minor cough x 2 -3 weeks. Today it is worse and is having some chest discomfort when he coughs

## 2013-05-25 NOTE — Discharge Instructions (Signed)

## 2013-05-26 NOTE — ED Provider Notes (Signed)
Medical screening examination/treatment/procedure(s) were performed by non-physician practitioner and as supervising physician I was immediately available for consultation/collaboration.  EKG Interpretation   None         Kordel Leavy T Essie Gehret, MD 05/26/13 1504 

## 2013-07-13 IMAGING — CR DG CHEST 2V
2 series · 2 of 2 positions shown · non-contrast
Comparison: Two-view chest 03/14/2011.

CLINICAL DATA: Cough and wheezing.

CHEST - 2 VIEW

[w chest pa]
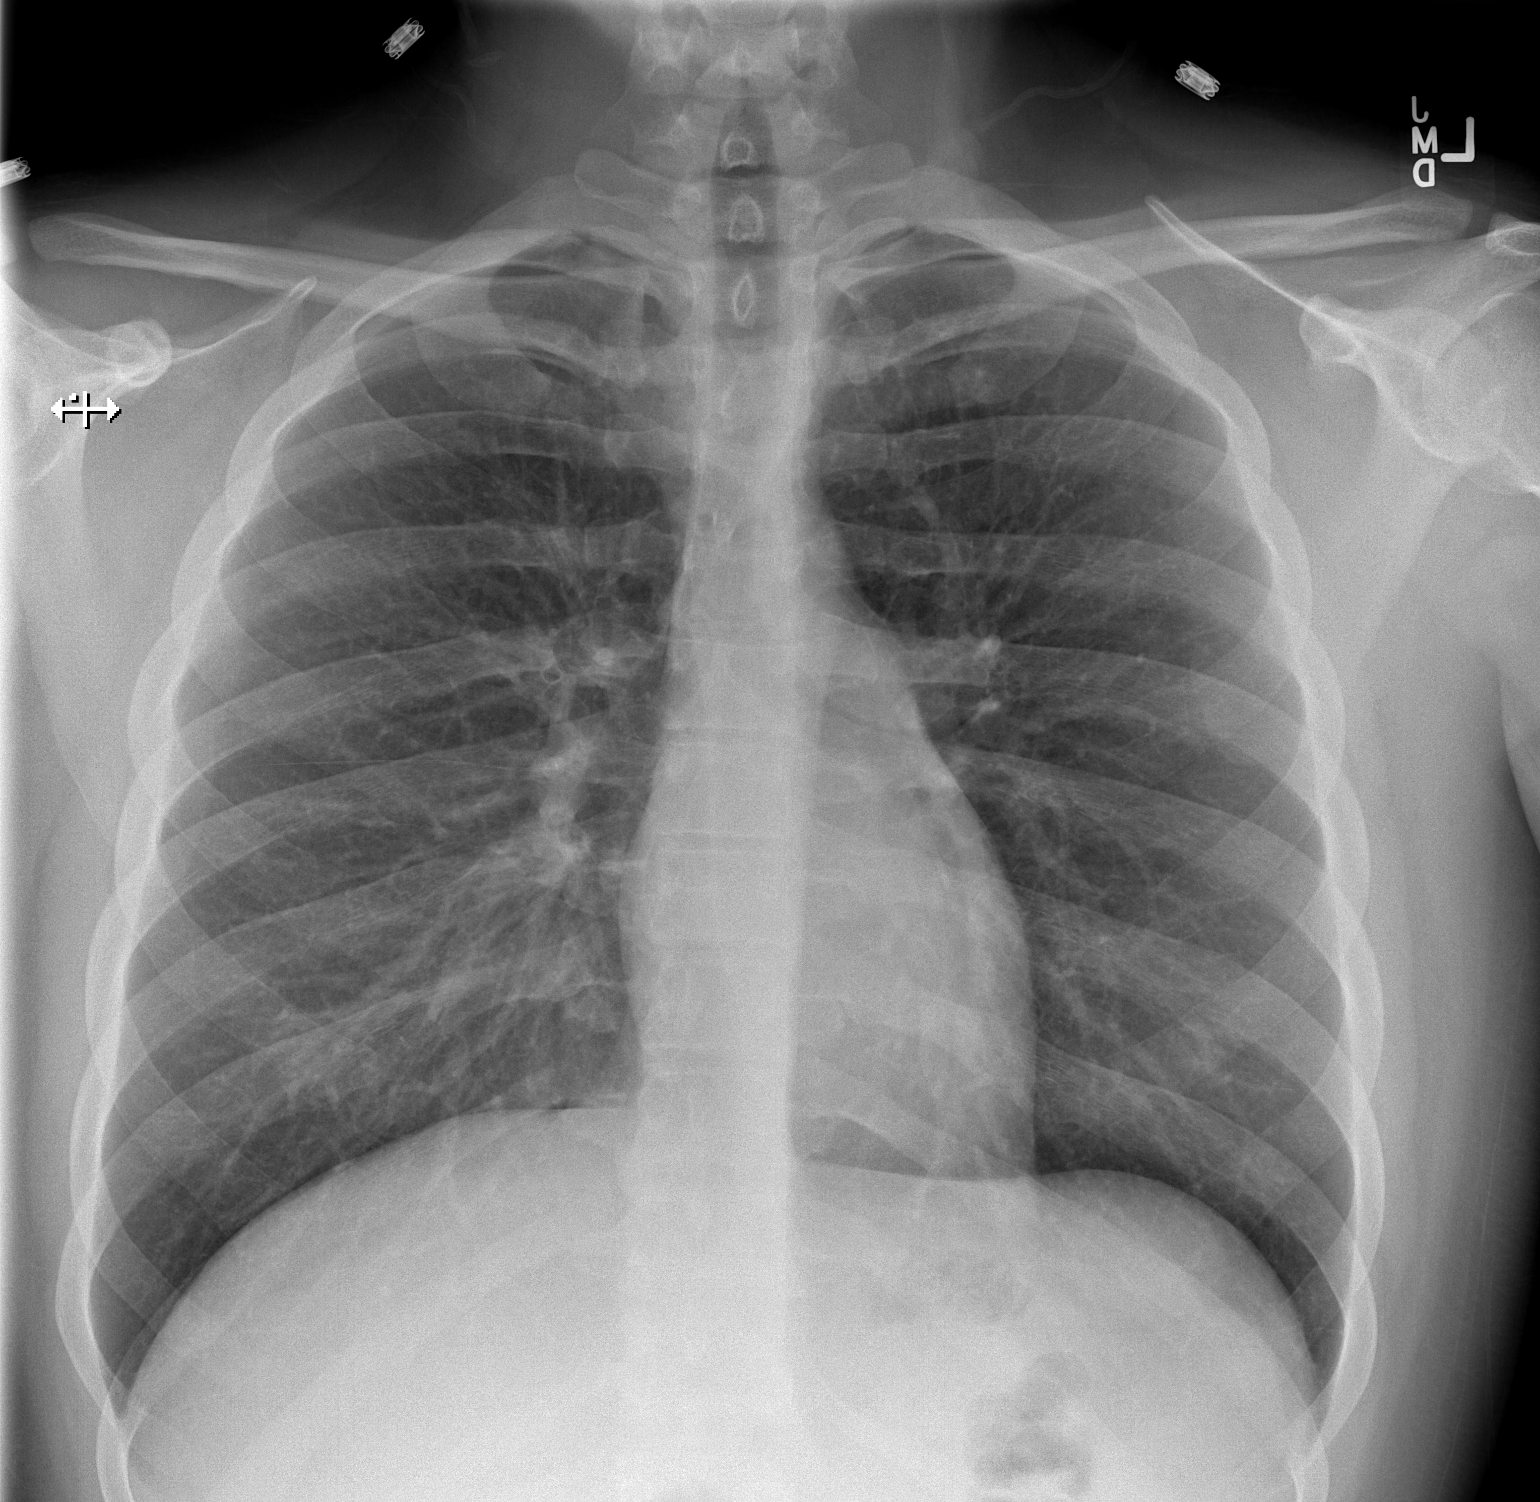

[w chest lat]
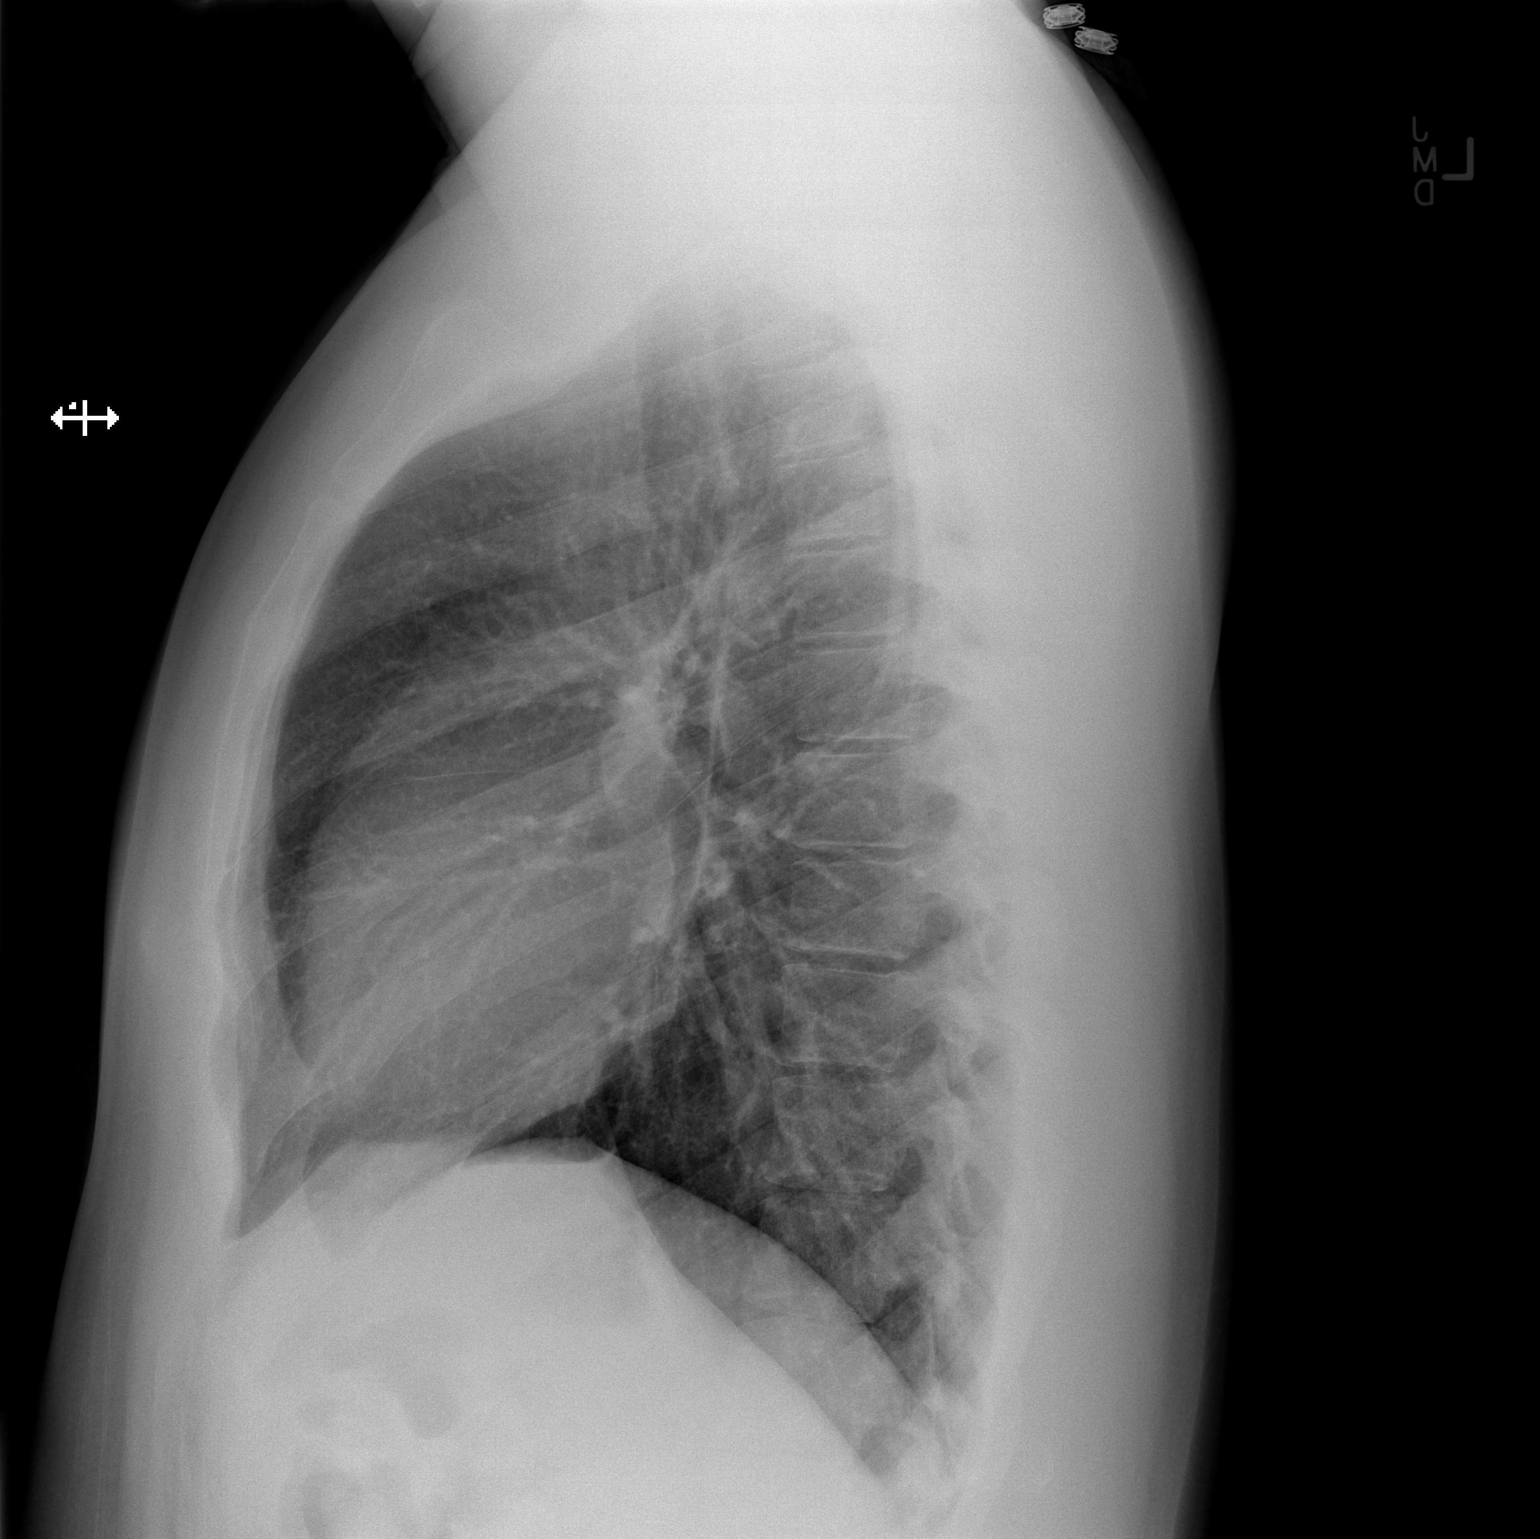

[2 of 2 positions shown; findings below may reference images not displayed]

FINDINGS: The heart size is normal.  The lungs are clear.  The
visualized soft tissues and bony thorax are unremarkable.
IMPRESSION: Negative chest.

## 2013-08-13 ENCOUNTER — Encounter (HOSPITAL_COMMUNITY): Payer: Self-pay | Admitting: Radiology

## 2013-08-13 ENCOUNTER — Emergency Department (HOSPITAL_COMMUNITY)
Admission: EM | Admit: 2013-08-13 | Discharge: 2013-08-13 | Disposition: A | Discharge status: Home / Self Care | Payer: Self-pay | Attending: Emergency Medicine | Admitting: Emergency Medicine

## 2013-08-13 ENCOUNTER — Emergency Department (HOSPITAL_COMMUNITY): Payer: Self-pay

## 2013-08-13 DIAGNOSIS — F17200 Nicotine dependence, unspecified, uncomplicated: Secondary | ICD-10-CM | POA: Insufficient documentation

## 2013-08-13 DIAGNOSIS — R109 Unspecified abdominal pain: Secondary | ICD-10-CM | POA: Insufficient documentation

## 2013-08-13 DIAGNOSIS — Z8709 Personal history of other diseases of the respiratory system: Secondary | ICD-10-CM | POA: Insufficient documentation

## 2013-08-13 LAB — CBC WITH DIFFERENTIAL/PLATELET
BASOS ABS: 0 10*3/uL (ref 0.0–0.1)
Basophils Relative: 1 % (ref 0–1)
EOS PCT: 2 % (ref 0–5)
Eosinophils Absolute: 0.1 10*3/uL (ref 0.0–0.7)
HEMATOCRIT: 40.8 % (ref 39.0–52.0)
HEMOGLOBIN: 13.5 g/dL (ref 13.0–17.0)
LYMPHS ABS: 1.8 10*3/uL (ref 0.7–4.0)
LYMPHS PCT: 24 % (ref 12–46)
MCH: 28.9 pg (ref 26.0–34.0)
MCHC: 33.1 g/dL (ref 30.0–36.0)
MCV: 87.4 fL (ref 78.0–100.0)
MONO ABS: 0.6 10*3/uL (ref 0.1–1.0)
MONOS PCT: 8 % (ref 3–12)
NEUTROS ABS: 5.1 10*3/uL (ref 1.7–7.7)
Neutrophils Relative %: 66 % (ref 43–77)
Platelets: 156 10*3/uL (ref 150–400)
RBC: 4.67 MIL/uL (ref 4.22–5.81)
RDW: 13.1 % (ref 11.5–15.5)
WBC: 7.7 10*3/uL (ref 4.0–10.5)

## 2013-08-13 LAB — URINALYSIS, ROUTINE W REFLEX MICROSCOPIC
Bilirubin Urine: NEGATIVE
GLUCOSE, UA: NEGATIVE mg/dL
HGB URINE DIPSTICK: NEGATIVE
KETONES UR: NEGATIVE mg/dL
LEUKOCYTES UA: NEGATIVE
Nitrite: NEGATIVE
PROTEIN: NEGATIVE mg/dL
Specific Gravity, Urine: 1.009 (ref 1.005–1.030)
UROBILINOGEN UA: 0.2 mg/dL (ref 0.0–1.0)
pH: 7.5 (ref 5.0–8.0)

## 2013-08-13 LAB — COMPREHENSIVE METABOLIC PANEL
ALBUMIN: 4.4 g/dL (ref 3.5–5.2)
ALK PHOS: 45 U/L (ref 39–117)
ALT: 33 U/L (ref 0–53)
AST: 32 U/L (ref 0–37)
BILIRUBIN TOTAL: 0.5 mg/dL (ref 0.3–1.2)
BUN: 11 mg/dL (ref 6–23)
CHLORIDE: 104 meq/L (ref 96–112)
CO2: 25 meq/L (ref 19–32)
CREATININE: 0.98 mg/dL (ref 0.50–1.35)
Calcium: 9.4 mg/dL (ref 8.4–10.5)
GFR calc Af Amer: >90 mL/min (ref >90)
GLUCOSE: 91 mg/dL (ref 70–99)
POTASSIUM: 4.3 meq/L (ref 3.7–5.3)
Sodium: 140 mEq/L (ref 137–147)
Total Protein: 7 g/dL (ref 6.0–8.3)

## 2013-08-13 LAB — LIPASE, BLOOD: Lipase: 17 U/L (ref 11–59)

## 2013-08-13 MED ORDER — SODIUM CHLORIDE 0.9 % IV SOLN: 1000.0000 mL | INTRAVENOUS | Status: DC

## 2013-08-13 MED ORDER — HYDROCODONE-ACETAMINOPHEN 5-325 MG PO TABS: 1.0000 | ORAL_TABLET | ORAL | Status: DC | PRN

## 2013-08-13 MED ORDER — ONDANSETRON 8 MG PO TBDP: 8.0000 mg | ORAL_TABLET | Freq: Three times a day (TID) | ORAL | Status: DC | PRN

## 2013-08-13 MED ORDER — SODIUM CHLORIDE 0.9 % IV SOLN
1000.0000 mL | Freq: Once | INTRAVENOUS | Status: AC
  Administered 2013-08-13: 1000 mL via INTRAVENOUS

## 2013-08-13 MED ORDER — MORPHINE SULFATE 4 MG/ML IJ SOLN
6.0000 mg | Freq: Once | INTRAMUSCULAR | Status: AC
  Administered 2013-08-13: 6 mg via INTRAVENOUS
  Filled 2013-08-13: qty 2

## 2013-08-13 NOTE — Progress Notes (Signed)
[facility] CL provided pt with a [place]. [place]. Spoke with [facility] ED CM about [place] with [place] [facility] [place] resources.

## 2013-08-13 NOTE — ED Notes (Addendum)
Pt reports left sided abdominal pain with swelling since 0500, 5/10. Last bowel movement today. Denies n/v/d.

## 2013-08-13 NOTE — ED Provider Notes (Signed)
CSN: [id number]     Arrival date & time 08/13/13  1228 History   First MD Initiated Contact with Patient 08/13/13 1259     Chief Complaint  Patient presents with  . Abdominal Pain     HPI Patient was emerged apartment because of some left-sided abdominal discomfort with swelling associated around 5:00 this morning.  Last bowel movement was yesterday.  No nausea vomiting or diarrhea.  Abdominal pain as crampy located in left abdomen.  No right lower quadrant pain.  Denies chest pain shortness of breath.  No fevers or chills.  No sick contacts.   Past Medical History  Diagnosis Date  . Bronchitis   . Upper respiratory infection, acute    Past Surgical History  Procedure Laterality Date  . Eye surgery     Family History  Problem Relation Age of Onset  . Asthma Mother   . Hypertension Mother   . Diabetes Other    History  Substance Use Topics  . Smoking status: Current Every Day Smoker -- 1.50 packs/day  . Smokeless tobacco: Not on file  . Alcohol Use: No    Review of Systems  All other systems reviewed and are negative.      Allergies  Review of patient's allergies indicates no known allergies.  Home Medications   Current Outpatient Rx  Name  Route  Sig  Dispense  Refill  . albuterol (PROVENTIL HFA;VENTOLIN HFA) 108 (90 BASE) MCG/ACT inhaler   Inhalation   Inhale 2 puffs into the lungs every 2 (two) hours as needed for wheezing or shortness of breath (cough).   1 Inhaler   0   . HYDROcodone-acetaminophen (NORCO/VICODIN) 5-325 MG per tablet   Oral   Take 1 tablet by mouth every 4 (four) hours as needed for moderate pain.   12 tablet   0   . ondansetron (ZOFRAN ODT) 8 MG disintegrating tablet   Oral   Take 1 tablet (8 mg total) by mouth every 8 (eight) hours as needed for nausea or vomiting.   12 tablet   0    BP 118/59  Pulse 71  Temp(Src) 97.5 F (36.4 C) (Oral)  Resp 16  SpO2 99% Physical Exam  Nursing note and vitals reviewed. Constitutional:  He is oriented to person, place, and time. He appears well-developed and well-nourished.  HENT:  Head: Normocephalic and atraumatic.  Eyes: EOM are normal.  Neck: Normal range of motion.  Cardiovascular: Normal rate, regular rhythm, normal heart sounds and intact distal pulses.   Pulmonary/Chest: Effort normal and breath sounds normal. No respiratory distress.  Abdominal: Soft. He exhibits no distension.  Mild left-sided abdominal tenderness  Musculoskeletal: Normal range of motion.  Neurological: He is alert and oriented to person, place, and time.  Skin: Skin is warm and dry.  Psychiatric: He has a normal mood and affect. Judgment normal.    ED Course  Procedures (including critical care time) Labs Review Labs Reviewed  CBC WITH DIFFERENTIAL  LIPASE, BLOOD  COMPREHENSIVE METABOLIC PANEL  URINALYSIS, ROUTINE W REFLEX MICROSCOPIC   Imaging Review Ct Abdomen Pelvis Wo Contrast  08/13/2013   CLINICAL DATA:  Left lateral abdomen swelling since 9 a.m., left lower quadrant pain since 5 a.m.  EXAM: CT ABDOMEN AND PELVIS WITHOUT CONTRAST  TECHNIQUE: Multidetector CT imaging of the abdomen and pelvis was performed following the standard protocol without intravenous contrast.  COMPARISON:  None.  FINDINGS: Visualized portions of the lung bases are clear. Bone windows show no acute findings.  Liver, gallbladder, spleen, pancreas, kidneys, and adrenal glands are normal.  There is a nonobstructive bowel gas pattern. However, several loops of jejunum in the left mid abdomen show mild to moderate wall thickening. There is no significant mesenteric inflammation. There are numerous small nonpathologic mesenteric lymph nodes in this area suggesting reactive etiology.  Bladder and reproductive organs are normal.  There is no ascites.  IMPRESSION: Findings suggest nonspecific enteritis involving multiple loops of left abdominal jejunum.   Electronically Signed   By: Esperanza Heir M.D.   On: 08/13/2013 14:49   I personally reviewed the imaging tests through PACS system I reviewed available ER/hospitalization records through the EMR    EKG Interpretation None      MDM   Final diagnoses:  Abdominal pain    CT scan without acute pathology.  Patient feels better.  He is ambulatory in his room.  Discharge home with a short course of pain medicine.    Lyanne Co, MD 08/13/13 [phone]

## 2013-08-13 NOTE — Progress Notes (Signed)
   CARE MANAGEMENT ED NOTE 08/13/2013  Patient:  Terry ConnorsHILL,Melville T   Account Number:  [id number]  Date Initiated:  08/13/2013  Documentation initiated by:  Edd ArbourGIBBS,Ayvah Caroll  Subjective/Objective Assessment:   23 yr old self pay listed with [place] address but [place] when [facility] spoke with him Pt with 2 [facility] ED visits in last 6 months No pcp listed     Subjective/Objective Assessment Detail:   c/o left sided abdominal pain with swelling since 0500, 5/10. Last bowel movement today. Denies n/v/d.  Male at bedside  Pt voiced understanding & appreciation of services offered     Action/Plan:   Cm spoke with [facility] staff, Stacy Cm reviewed EPIC CM reviewed and provided pt with lists of self pay resources for [place] CM discussed Self [place] services [place] health dept   Action/Plan Detail:   Lists given to male at bedside Cm discussed importance of pcp for f/u services and maintaining chronic medical issues   Anticipated DC Date:  08/13/2013     Status Recommendation to Physician:   Result of Recommendation:    Other ED Services  Consult Working Plan    DC Planning Services  Other  Outpatient Services - Pt will follow up  PCP issues  [facility] / [facility] (established/new)    Choice offered to / List presented to:            Status of service:  Completed, signed off  ED Comments:   ED Comments Detail:  Entered following information in EPIC f/u-d/c section to be given to pt at d/c by ED RN  Follow-up With Details Comments Contact Info [place] self [place] medical providers  Schedule an appointment as soon as possible for a visit as ordered by ED doctor for follow up care.  See lists provided

## 2013-11-27 ENCOUNTER — Encounter (HOSPITAL_BASED_OUTPATIENT_CLINIC_OR_DEPARTMENT_OTHER): Payer: Self-pay | Admitting: Emergency Medicine

## 2013-11-27 ENCOUNTER — Emergency Department (HOSPITAL_BASED_OUTPATIENT_CLINIC_OR_DEPARTMENT_OTHER)
Admission: EM | Admit: 2013-11-27 | Discharge: 2013-11-28 | Disposition: A | Discharge status: Home / Self Care | Payer: Self-pay | Attending: Emergency Medicine | Admitting: Emergency Medicine

## 2013-11-27 DIAGNOSIS — S61209A Unspecified open wound of unspecified finger without damage to nail, initial encounter: Secondary | ICD-10-CM | POA: Insufficient documentation

## 2013-11-27 DIAGNOSIS — Z23 Encounter for immunization: Secondary | ICD-10-CM | POA: Insufficient documentation

## 2013-11-27 DIAGNOSIS — F17200 Nicotine dependence, unspecified, uncomplicated: Secondary | ICD-10-CM | POA: Insufficient documentation

## 2013-11-27 DIAGNOSIS — Y990 Civilian activity done for income or pay: Secondary | ICD-10-CM | POA: Insufficient documentation

## 2013-11-27 DIAGNOSIS — IMO0002 Reserved for concepts with insufficient information to code with codable children: Secondary | ICD-10-CM

## 2013-11-27 DIAGNOSIS — Z8709 Personal history of other diseases of the respiratory system: Secondary | ICD-10-CM | POA: Insufficient documentation

## 2013-11-27 DIAGNOSIS — Y9389 Activity, other specified: Secondary | ICD-10-CM | POA: Insufficient documentation

## 2013-11-27 DIAGNOSIS — W268XXA Contact with other sharp object(s), not elsewhere classified, initial encounter: Secondary | ICD-10-CM | POA: Insufficient documentation

## 2013-11-27 DIAGNOSIS — Y9289 Other specified places as the place of occurrence of the external cause: Secondary | ICD-10-CM | POA: Insufficient documentation

## 2013-11-27 MED ORDER — TETANUS-DIPHTH-ACELL PERTUSSIS 5-2.5-18.5 LF-MCG/0.5 IM SUSP
0.5000 mL | Freq: Once | INTRAMUSCULAR | Status: AC
  Administered 2013-11-27: 0.5 mL via INTRAMUSCULAR
  Filled 2013-11-27: qty 0.5

## 2013-11-27 NOTE — ED Provider Notes (Signed)
CSN: [id number]     Arrival date & time 11/27/13  2031 History   This chart was scribed for Terry Bailey Smitty Cords, MD, by Yevette Edwards, ED Scribe. This patient was seen in room MH03/MH03 and the patient's care was started at 11:26 PM.  First MD Initiated Contact with Patient 11/27/13 2036     Chief Complaint  Patient presents with  . Extremity Laceration    Patient is a 23 y.o. male presenting with hand pain. The history is provided by the patient. No language interpreter was used.  Hand Pain This is a new problem. The current episode started 1 to 2 hours ago. The problem occurs constantly. The problem has not changed since onset.Pertinent negatives include no headaches. Nothing aggravates the symptoms. Nothing relieves the symptoms. He has tried nothing for the symptoms. The treatment provided no relief.   HPI Comments: ERHARD SENSKE is a 23 y.o. male who presents to the Emergency Department complaining of laceration to his right middle finger which occurred approximately 90 minutes ago when the pt injured his finger on sheet metal. The bleeding is controlled at bedside. He is unsure of his tetanus status.   Past Medical History  Diagnosis Date  . Bronchitis   . Upper respiratory infection, acute    Past Surgical History  Procedure Laterality Date  . Eye surgery     Family History  Problem Relation Age of Onset  . Asthma Mother   . Hypertension Mother   . Diabetes Other    History  Substance Use Topics  . Smoking status: Current Every Day Smoker -- 2.00 packs/day  . Smokeless tobacco: Not on file  . Alcohol Use: No    Review of Systems  Skin: Positive for wound.  Neurological: Negative for headaches.  All other systems reviewed and are negative.     Allergies  Review of patient's allergies indicates no known allergies.  Home Medications   Prior to Admission medications   Medication Sig Start Date End Date Taking? Authorizing Provider  acetaminophen (TYLENOL) 160  MG/5ML elixir Take 15 mg/kg by mouth every 4 (four) hours as needed for fever.   Yes Historical Provider, MD  albuterol (PROVENTIL HFA;VENTOLIN HFA) 108 (90 BASE) MCG/ACT inhaler Inhale 2 puffs into the lungs every 2 (two) hours as needed for wheezing or shortness of breath (cough). 02/08/13   Hurman Horn, MD  HYDROcodone-acetaminophen (NORCO/VICODIN) 5-325 MG per tablet Take 1 tablet by mouth every 4 (four) hours as needed for moderate pain. 08/13/13   Lyanne Co, MD  ondansetron (ZOFRAN ODT) 8 MG disintegrating tablet Take 1 tablet (8 mg total) by mouth every 8 (eight) hours as needed for nausea or vomiting. 08/13/13   Lyanne Co, MD   Triage Vitals: BP 150/63  Pulse 90  Temp(Src) 99.2 F (37.3 C) (Oral)  Resp 16  Ht 6\' 2"  (1.88 m)  Wt 250 lb (113.399 kg)  BMI 32.08 kg/m2  SpO2 100%  Physical Exam  Constitutional: He is oriented to person, place, and time. He appears well-developed and well-nourished. No distress.  HENT:  Head: Normocephalic.  Mouth/Throat: Oropharynx is clear and moist and mucous membranes are normal. No oropharyngeal exudate.  Eyes: Pupils are equal, round, and reactive to light.  Neck: Normal range of motion. Neck supple.  Cardiovascular: Normal rate, regular rhythm, normal heart sounds and intact distal pulses.   Cap refill less than 2 seconds in all fingers of right hand.   Pulmonary/Chest: Effort normal and breath  sounds normal. No respiratory distress. He has no wheezes. He has no rales.  Abdominal: Soft. Bowel sounds are normal. There is no tenderness.  Musculoskeletal: Normal range of motion.  No tendon involvement.   Neurological: He is alert and oriented to person, place, and time.  right hand neurovascularly intact. Cap refill < 2 sec to all distributions  Skin: Skin is warm and dry.  8 mm laceration, tip of right middle finger.   Psychiatric: He has a normal mood and affect.    ED Course  Procedures (including critical care time)  DIAGNOSTIC  STUDIES: Oxygen Saturation is 100% on room air, normal by my interpretation.    COORDINATION OF CARE:  11:32 PM- Discussed treatment plan with patient, and the patient agreed to the plan. The plan includes suturing.   Labs Review Labs Reviewed - No data to display  Imaging Review No results found.   EKG Interpretation None      MDM   Final diagnoses:  None    LACERATION REPAIR Performed by: Jasmine AwePALUMBO-RASCH,Rainn Bullinger K Authorized by: Jasmine AwePALUMBO-RASCH,Ashby Leflore K Consent: Verbal consent obtained. Risks and benefits: risks, benefits and alternatives were discussed Consent given by: patient Patient identity confirmed: provided demographic data Prepped and Draped in normal sterile fashion Wound explored  Laceration Location: tip of the right middle finger  Laceration Length: .8cm  No Foreign Bodies seen or palpated  Irrigation method: syringe Amount of cleaning: extensive  Skin closure: dermabond  Technique: dermabond  Patient tolerance: Patient tolerated the procedure well with no immediate complications.  I personally performed the services described in this documentation, which was scribed in my presence. The recorded information has been reviewed and is accurate.    Jasmine AweApril K Airika Alkhatib-Rasch, MD 11/28/13 [phone]

## 2013-11-27 NOTE — ED Notes (Signed)
Pt c/o right index finger laceration by metal while at work x 1 hr ago

## 2013-11-28 ENCOUNTER — Encounter (HOSPITAL_BASED_OUTPATIENT_CLINIC_OR_DEPARTMENT_OTHER): Payer: Self-pay | Admitting: Emergency Medicine

## 2013-11-28 MED ORDER — TRAMADOL HCL 50 MG PO TABS
50.0000 mg | ORAL_TABLET | Freq: Once | ORAL | Status: DC
  Filled 2013-11-28: qty 1

## 2013-11-28 NOTE — ED Notes (Signed)
Pt ambulating independently w/ steady gait on d/c in no acute distress, A&Ox4.D/c instructions reviewed w/ pt and family - pt and family deny any further questions or concerns at present.  

## 2013-11-28 NOTE — ED Notes (Signed)
Pt sustained laceration to distal rt fourth digit while cutting sheet metal. No bleeding noted on exam.

## 2014-08-13 ENCOUNTER — Encounter (HOSPITAL_BASED_OUTPATIENT_CLINIC_OR_DEPARTMENT_OTHER): Payer: Self-pay

## 2014-08-13 ENCOUNTER — Emergency Department (HOSPITAL_BASED_OUTPATIENT_CLINIC_OR_DEPARTMENT_OTHER)
Admission: EM | Admit: 2014-08-13 | Discharge: 2014-08-13 | Disposition: A | Discharge status: Home / Self Care | Payer: Self-pay | Attending: Emergency Medicine | Admitting: Emergency Medicine

## 2014-08-13 DIAGNOSIS — K088 Other specified disorders of teeth and supporting structures: Secondary | ICD-10-CM | POA: Insufficient documentation

## 2014-08-13 DIAGNOSIS — K0889 Other specified disorders of teeth and supporting structures: Secondary | ICD-10-CM

## 2014-08-13 DIAGNOSIS — Z8709 Personal history of other diseases of the respiratory system: Secondary | ICD-10-CM | POA: Insufficient documentation

## 2014-08-13 DIAGNOSIS — K029 Dental caries, unspecified: Secondary | ICD-10-CM | POA: Insufficient documentation

## 2014-08-13 DIAGNOSIS — Z720 Tobacco use: Secondary | ICD-10-CM | POA: Insufficient documentation

## 2014-08-13 MED ORDER — KETOROLAC TROMETHAMINE 30 MG/ML IJ SOLN
INTRAMUSCULAR | Status: AC
  Filled 2014-08-13: qty 1

## 2014-08-13 MED ORDER — KETOROLAC TROMETHAMINE 30 MG/ML IJ SOLN
60.0000 mg | Freq: Once | INTRAMUSCULAR | Status: AC
  Administered 2014-08-13: 60 mg via INTRAMUSCULAR
  Filled 2014-08-13: qty 2

## 2014-08-13 MED ORDER — IBUPROFEN 800 MG PO TABS: 800.0000 mg | ORAL_TABLET | Freq: Three times a day (TID) | ORAL | Status: DC

## 2014-08-13 MED ORDER — PENICILLIN V POTASSIUM 500 MG PO TABS: 500.0000 mg | ORAL_TABLET | Freq: Four times a day (QID) | ORAL | Status: AC

## 2014-08-13 MED ORDER — BUPIVACAINE-EPINEPHRINE (PF) 0.5% -1:200000 IJ SOLN
1.8000 mL | Freq: Once | INTRAMUSCULAR | Status: AC
  Administered 2014-08-13: 1.8 mL
  Filled 2014-08-13: qty 1.8

## 2014-08-13 NOTE — ED Notes (Signed)
Left upper and lower toothache x 5 days

## 2014-08-13 NOTE — Discharge Instructions (Signed)
Dental Pain °A tooth ache may be caused by cavities (tooth decay). Cavities expose the nerve of the tooth to air and hot or cold temperatures. It may come from an infection or abscess (also called a boil or furuncle) around your tooth. It is also often caused by dental caries (tooth decay). This causes the pain you are having. °DIAGNOSIS  °Your caregiver can diagnose this problem by exam. °TREATMENT  °· If caused by an infection, it may be treated with medications which kill germs (antibiotics) and pain medications as prescribed by your caregiver. Take medications as directed. °· Only take over-the-counter or prescription medicines for pain, discomfort, or fever as directed by your caregiver. °· Whether the tooth ache today is caused by infection or dental disease, you should see your dentist as soon as possible for further care. °SEEK MEDICAL CARE IF: °The exam and treatment you received today has been provided on an emergency basis only. This is not a substitute for complete medical or dental care. If your problem worsens or new problems (symptoms) appear, and you are unable to meet with your dentist, call or return to this location. °SEEK IMMEDIATE MEDICAL CARE IF:  °· You have a fever. °· You develop redness and swelling of your face, jaw, or neck. °· You are unable to open your mouth. °· You have severe pain uncontrolled by pain medicine. °MAKE SURE YOU:  °· Understand these instructions. °· Will watch your condition. °· Will get help right away if you are not doing well or get worse. °Document Released: 04/25/2005 Document Revised: 07/18/2011 Document Reviewed: 12/12/2007 °ExitCare® Patient Information ©2015 [employer]. This information is not intended to replace advice given to you by your health care provider. Make sure you discuss any questions you have with your health care provider. ° °Dental Care and Dentist Visits °Dental care supports good overall health. Regular dental visits can also help you  avoid dental pain, bleeding, infection, and other more serious health problems in the future. It is important to keep the mouth healthy because diseases in the teeth, gums, and other oral tissues can spread to other areas of the body. Some problems, such as diabetes, heart disease, and pre-term labor have been associated with poor oral health.  °See your dentist every 6 months. If you experience emergency problems such as a toothache or broken tooth, go to the dentist right away. If you see your dentist regularly, you may catch problems early. It is easier to be treated for problems in the early stages.  °WHAT TO EXPECT AT A DENTIST VISIT  °Your dentist will look for many common oral health problems and recommend proper treatment. At your regular dental visit, you can expect: °· Gentle cleaning of the teeth and gums. This includes scraping and polishing. This helps to remove the sticky substance around the teeth and gums (plaque). Plaque forms in the mouth shortly after eating. Over time, plaque hardens on the teeth as tartar. If tartar is not removed regularly, it can cause problems. Cleaning also helps remove stains. °· Periodic X-rays. These pictures of the teeth and supporting bone will help your dentist assess the health of your teeth. °· Periodic fluoride treatments. Fluoride is a natural mineral shown to help strengthen teeth. Fluoride treatment involves applying a fluoride gel or varnish to the teeth. It is most commonly done in children. °· Examination of the mouth, tongue, jaws, teeth, and gums to look for any oral health problems, such as: °¨ Cavities (dental caries). This is   decay on the tooth caused by plaque, sugar, and acid in the mouth. It is best to catch a cavity when it is small.  Inflammation of the gums caused by plaque buildup (gingivitis).  Problems with the mouth or malformed or misaligned teeth.  Oral cancer or other diseases of the soft tissues or jaws. KEEP YOUR TEETH AND GUMS  HEALTHY For healthy teeth and gums, follow these general guidelines as well as your dentist's specific advice:  Have your teeth professionally cleaned at the dentist every 6 months.  Brush twice daily with a fluoride toothpaste.  Floss your teeth daily.  Ask your dentist if you need fluoride supplements, treatments, or fluoride toothpaste.  Eat a healthy diet. Reduce foods and drinks with added sugar.  Avoid smoking. TREATMENT FOR ORAL HEALTH PROBLEMS If you have oral health problems, treatment varies depending on the conditions present in your teeth and gums.  Your caregiver will most likely recommend good oral hygiene at each visit.  For cavities, gingivitis, or other oral health disease, your caregiver will perform a procedure to treat the problem. This is typically done at a separate appointment. Sometimes your caregiver will refer you to another dental specialist for specific tooth problems or for surgery. SEEK IMMEDIATE DENTAL CARE IF:  You have pain, bleeding, or soreness in the gum, tooth, jaw, or mouth area.  A permanent tooth becomes loose or separated from the gum socket.  You experience a blow or injury to the mouth or jaw area. Document Released: 01/05/2011 Document Revised: 07/18/2011 Document Reviewed: 01/05/2011 [facility] Patient Information 2015 [place], [place]. This information is not intended to replace advice given to you by your health care provider. Make sure you discuss any questions you have with your health care provider.  Please of your antibiotics as instructed. He may take anti-inflammatory for any discomfort he may experience. Please use the resources guide in order to find definitive dental care. Return to ED for new or worsening symptoms.   Emergency Department Resource Guide 1) Find a Doctor and Pay Out of Pocket Although you won't have to find out who is covered by your insurance plan, it is a good idea to ask around and get recommendations. You  will then need to call the office and see if the doctor you have chosen will accept you as a new patient and what types of options they offer for patients who are self-pay. Some doctors offer discounts or will set up payment plans for their patients who do not have insurance, but you will need to ask so you aren't surprised when you get to your appointment.  2) Contact Your Local Health Department Not all health departments have doctors that can see patients for sick visits, but many do, so it is worth a call to see if yours does. If you don't know where your local health department is, you can check in your phone book. The CDC also has a tool to help you locate your state's health department, and many state websites also have listings of all of their local health departments.  3) Find a Walk-in Clinic If your illness is not likely to be very severe or complicated, you may want to try a walk in clinic. These are popping up all over the country in pharmacies, drugstores, and shopping centers. They're usually staffed by nurse practitioners or physician assistants that have been trained to treat common illnesses and complaints. They're usually fairly quick and inexpensive. However, if you have serious medical issues  or chronic medical problems, these are probably not your best option.  No Primary Care Doctor: - Call Health Connect at  [phone] - they can help you locate a primary care doctor that  accepts your insurance, provides certain services, etc. - Physician Referral Service- [phone]  Chronic Pain Problems: Organization         Address  Phone   Notes  [facility]  [phone] Patients need to be referred by their primary care doctor.   Medication Assistance: Organization         Address  Phone   Notes  [facility] Medication [facility] [address] [place] [place], [place] [address] [phone] --Must be a resident of [facility] -- Must  have NO insurance coverage whatsoever (no Medicaid/ Medicare, etc.) -- The pt. MUST have a primary care doctor that directs their care regularly and follows them in the community   [facility]  [phone]   [employer]  [phone]    Agencies that provide inexpensive medical care: Organization         Address  Phone   Notes  [facility]  [phone]   [facility]    [phone]   [facility] [address] [place], [place] [address] [phone]   [facility] [address] [place] [place] [phone]   Planned Parenthood    [phone]   [facility]    [phone]   [facility]  [address], [place] Phone:  [phone], Fax:  [phone] Hours of Operation:  9 am - 6 pm, M-F.  Also accepts Medicaid/Medicare and self-pay.  [facility]  [address], [place] Phone: [phone], Fax: [phone]. Hours of Operation:  8:30 am - 5:30 pm, M-F.  Also accepts Medicaid and self-pay.  [facility] [place] [address] [place] Phone: [phone]   [facility] [address] Natasha Bence [place], [place] [phone], Ext. [phone] Mondays & Thursdays: 7-9 AM.  First 15 patients are seen on a first come, first serve basis.    Medicaid-accepting [facility] Providers:  Organization         Address  Phone   Notes  [facility] [address], [place] [phone] Also accepts self-pay patients.  [facility] [address] Laurell Josephs [place], [place]  [phone]   [facility] [address], [place] [phone]   [facility] [address], [place] [phone]   Renaye Rakers [address], [place]   [phone] Only accepts [place] patients after  they have their name applied to their card.   Self-Pay (no insurance) in [facility]:  Organization         Address  Phone   Notes  Sickle Cell Patients, [facility] [address] [place], [place] [phone]   [facility] [address] [place], [place] [phone]   [facility] [place]  [address], [place] [phone]   [facility]/Dr. Osei-Bonsu  [address], [place] or [address], [place] [phone] Phone number for both [place] and [place] locations is the same.  [facility] [address], [place] [phone]   [facility] [address], [place] or [address] [phone]   [facility] [address] [place] [phone], phone; [phone], fax Sees patients 1st and 3rd Saturday of every month.  Must not qualify for public or private insurance (i.e. Medicaid, Medicare, [place] Health Choice, Veterans' Benefits)  Household income should be no more than 200% of the poverty level The clinic cannot treat you if you are pregnant or think you are pregnant  Sexually transmitted diseases are not treated at the clinic.    Dental Care: Organization         Address  Phone  Notes  [facility] Department of [facility] [address] [place], [place] [phone] Accepts children up to age 66 who are enrolled in [place] or [place] Health Choice; pregnant women with a Medicaid card; and children who have applied for Medicaid or [place] Health Choice, but were declined, whose parents can pay a reduced fee at time of service.  [facility] Department of [facility]  [address], [place] [phone] Accepts children up to age 45 who are enrolled in [place] or [place] Health Choice; pregnant women with a Medicaid card; and children who  have applied for Medicaid or [place] Health Choice, but were declined, whose parents can pay a reduced fee at time of service.  [facility]  [address] [place], [place] [phone] Patients are seen by appointment only. Walk-ins are not accepted. [facility] will see patients 109 years of age and older. Monday - Tuesday (8am-5pm) Most Wednesdays (8:30-5pm) $30 per visit, cash only  [facility] Adult Dental Access PROGRAM  [address], [facility] [phone] Patients are seen by appointment only. Walk-ins are not accepted. [facility] will see patients 31 years of age and older. One Wednesday Evening (Monthly: Volunteer Based).  $30 per visit, cash only  [employer]  [phone] for adults; Children under age 43, call Graduate Pediatric Dentistry at [phone]. Children aged 55-14, please call [phone] to request a pediatric application.  Dental services are provided in all areas of dental care including fillings, crowns and bridges, complete and partial dentures, implants, gum treatment, root canals, and extractions. Preventive care is also provided. Treatment is provided to both adults and children. Patients are selected via a lottery and there is often a waiting list.   [facility] [address], [place]  [phone] [email or web address]   [facility] [address] [place], [place] [phone], Ext. [phone] Second and Fourth Thursday of each month, opens at 6:30 AM; Clinic ends at 9 AM.  Patients are seen on a first-come first-served basis, and a limited number are seen during each clinic.   [facility]  [address] Ether Griffins [place], [place] [phone]   Eligibility Requirements You must have lived in [place], [place], or [place] counties for at least the last three months.   You cannot be eligible for state or federal sponsored [place], including [place], [place], or [employer].   You generally cannot be eligible for healthcare insurance through your employer.    How to apply: Eligibility screenings are held every Tuesday and Wednesday afternoon from 1:00 pm until 4:00 pm. You do not need an appointment for the interview!  [facility] [address], [place], [place] [phone]   [facility]  [phone]   [facility]  [phone]   [facility]  [phone]    Behavioral Health Resources in the Community: Intensive Outpatient Programs Organization         Address  Phone  Notes  [facility] [address], [place], [place] [phone]   [facility] [address], [place], [place] [phone]   [facility] [address], [place], [place]  [phone]   [facility] [address],  [place], [place] [phone] or [phone]   Substance Abuse Resources Organization         Address  Phone  Notes  [facility]  [phone]   [facility]  [phone]   [facility]  [phone]   [facility]  [phone]   Residential & Outpatient Substance Abuse Program  [phone]   Psychological Services Organization         Address  Phone  Notes  [facility]  [phone]- [phone]   [facility]  [phone]- [phone]   [facility] [address], [place] [phone] or [phone]    Mobile Crisis Teams Organization         Address  Phone  Notes  [facility], Mobile Crisis Care Unit  [phone]   [facility]  [address] [place], [place] [phone]   Doristine LocksSharon DeEsch [address] [place] [phone]    Self-Help/Support Groups Organization         Address  Phone             Notes  [facility] [place] -  variety of support groups  336- [phone] Call for more information  Narcotics Anonymous (NA), [facility] [address], [place]  2 meetings at this location   Statisticianesidential Treatment Programs Organization         Address  Phone  Notes  [facility] [address],    [place]  [phone]   [facility]  [address], [place] [phone], [place], [place] [phone]   [facility] [address], [place] [phone] Admissions: 8am-3pm M-F  [facility] [address]    [place], [place] [phone]   [facility] [address], [place], [place] [phone]   [facility] [address]  [place], [place] [phone]   [facility] - Intensive Outpatient [address], [place], [place] [phone]   [facility] ([facility] [address]  [place], [place] [phone] or [phone]   [facility] ([facility]) [address] [place], [place] [phone] Accepts Medicaid  [facility] [address]  [place] [phone] Substance Abuse/Addiction Treatment   [facility] Resources Organization         Address  Phone  Notes  [facility]  [phone]) [phone]   Angie FavaJulie Brannon, PhD [address] [place], [place]   [phone] or [phone]   [facility]   [address] [place], [place] [phone]) [phone]   [facility] [address], [place], [place] [phone]) [phone] Insurance/Medicaid/sponsorship through [facility]  [facility] [address]  [place], [place] [phone] Therapy/tele-psych/case  [facility] [address]   [place], [place] [phone]    Dr. Lolly Mustache  [phone]   [facility]  [employer] [facility] Dept. 1) [address] [place] 2) [address], [place] 3)  [address], [place] [phone]  [phone]   [facility] Child Abuse Hotline (901)562-5479 or [phone] (After Hours)

## 2014-08-13 NOTE — ED Notes (Signed)
Presents today w/ "tooth pain" on Lt upper side, onset Saturday, worsening since onset. States unable to close mouth/ jaw completely, no difficulty with speech or swallowing

## 2014-08-13 NOTE — ED Notes (Signed)
MD at bedside. 

## 2014-08-13 NOTE — ED Notes (Signed)
Pt request no narcotic prescription, pt states needs to drive and still be able to work.

## 2014-08-13 NOTE — ED Provider Notes (Signed)
CSN: [id number]     Arrival date & time 08/13/14  1239 History   First MD Initiated Contact with Patient 08/13/14 1326     Chief Complaint  Patient presents with  . Dental Pain     (Consider location/radiation/quality/duration/timing/severity/associated sxs/prior Treatment) HPI Terry Bailey is a 24 y.o. male because in for evaluation of left upper dental pain. Patient states since Saturday he has had a constant ache in his upper left tooth that he rates as a 10/10. He is tried taking 2 extra strength Tylenol without relief of his symptoms. He does not have dental care. nothing improves his pain. Closing his jaw and rubbing his tongue over his teeth exacerbate his pain. He denies difficulty swallowing, breathing,opening his mouth, fevers, chest pain, nausea or vomiting, sore throat.  Past Medical History  Diagnosis Date  . Bronchitis   . Upper respiratory infection, acute    Past Surgical History  Procedure Laterality Date  . Eye surgery     Family History  Problem Relation Age of Onset  . Asthma Mother   . Hypertension Mother   . Diabetes Other    History  Substance Use Topics  . Smoking status: Current Every Day Smoker -- 0.00 packs/day  . Smokeless tobacco: Not on file  . Alcohol Use: No    Review of Systems  Constitutional: Negative for fever.  HENT: Positive for dental problem. Negative for drooling, ear discharge, ear pain, facial swelling and mouth sores.   Respiratory: Negative for shortness of breath.   Cardiovascular: Negative for chest pain.  Skin: Negative for rash.      Allergies  Review of patient's allergies indicates no known allergies.  Home Medications   Prior to Admission medications   Medication Sig Start Date End Date Taking? Authorizing Provider  ibuprofen (ADVIL,MOTRIN) 800 MG tablet Take 1 tablet (800 mg total) by mouth 3 (three) times daily. 08/13/14   Joycie Peek, PA-C  penicillin v potassium (VEETID) 500 MG tablet Take 1 tablet (500 mg  total) by mouth 4 (four) times daily. 08/13/14 08/20/14  Jonell Brumbaugh, PA-C   BP 150/81 mmHg  Pulse 64  Temp(Src) 97.8 F (36.6 C) (Oral)  Resp 18  Ht  (1.854 m)  Wt 250 lb (113.399 kg)  BMI 32.99 kg/m2  SpO2 99% Physical Exam  Constitutional:  Awake, alert, nontoxic appearance.  HENT:  Head: Atraumatic.  Discomfort located to upper left premolar. Mild chip to posterior upper left molar with active caries. Mucous membranes are moist. No unilateral tonsillar swelling, uvula midline, no glossal swelling or elevation. No trismus. No fluctuance or evidence of a drainable abscess. No other evidence of emergent infection, Retropharyngeal or Peritonsillar abscess, Ludwig or Vincents angina. Tolerating secretions well. Patent airway   Eyes: Right eye exhibits no discharge. Left eye exhibits no discharge.  Neck: Neck supple.  Pulmonary/Chest: Effort normal. He exhibits no tenderness.  Abdominal: Soft. There is no tenderness. There is no rebound.  Musculoskeletal: He exhibits no tenderness.  Baseline ROM, no obvious new focal weakness.  Neurological:  Mental status and motor strength appears baseline for patient and situation.  Skin: No rash noted.  Psychiatric: He has a normal mood and affect.  Nursing note and vitals reviewed.   ED Course  Procedures (including critical care time)  NERVE BLOCK Performed by: Sharlene Motts Consent: Verbal consent obtained. Required items: required blood products, implants, devices, and special equipment available Time out: Immediately prior to procedure a "time out" was called to  verify the correct patient, procedure, equipment, support staff and site/side marked as required.  Indication: dental pain Nerve block body site: left upper molar periosteal.  Preparation: Patient was prepped and draped in the usual sterile fashion. Needle gauge: 24 G Location technique: anatomical landmarks  Local anesthetic: bupivacaine  Anesthetic total:  1.8 ml  Outcome: pain improved Patient tolerance: Patient tolerated the procedure well with no immediate complications.  Labs Review Labs Reviewed - No data to display  Imaging Review No results found.   EKG Interpretation None     Meds given in ED:  Medications  bupivacaine-epinephrine (MARCAINE W/ EPI) 0.5% -1:200000 injection 1.8 mL (1.8 mLs Infiltration Given 08/13/14 1348)  ketorolac (TORADOL) 30 MG/ML injection 60 mg (60 mg Intramuscular Given 08/13/14 1348)  ketorolac (TORADOL) 30 MG/ML injection (  Duplicate 08/13/14 1348)    New Prescriptions   IBUPROFEN (ADVIL,MOTRIN) 800 MG TABLET    Take 1 tablet (800 mg total) by mouth 3 (three) times daily.   PENICILLIN V POTASSIUM (VEETID) 500 MG TABLET    Take 1 tablet (500 mg total) by mouth 4 (four) times daily.   Filed Vitals:   08/13/14 1256  BP: 150/81  Pulse: 64  Temp: 97.8 F (36.6 C)  TempSrc: Oral  Resp: 18  Height: 6\' 1"  (1.854 m)  Weight: 250 lb (113.399 kg)  SpO2: 99%    MDM  Vitals stable - WNL -afebrile Pt resting comfortably in ED. Reports improving pain after dental block done in ED. PE-chipped posterior left molar with cavity. No evidence of abscess. No trismus or glossal elevation. No evidence of acute bacterial process or airway compromise.  DDX--patient discharged with antibiotics, anti-inflammatories and resource guide in order to find definitive dental care.  I discussed all relevant lab findings and imaging results with pt and they verbalized understanding. Discussed f/u with PCP within 48 hrs and return precautions, pt very amenable to plan.  Final diagnoses:  Pain, dental        Joycie PeekBenjamin Tiawana Forgy, PA-C 08/13/14 1405  Vanetta MuldersScott Zackowski, MD 08/14/14 [phone]

## 2014-08-28 ENCOUNTER — Encounter (HOSPITAL_BASED_OUTPATIENT_CLINIC_OR_DEPARTMENT_OTHER): Payer: Self-pay | Admitting: *Deleted

## 2014-08-28 ENCOUNTER — Emergency Department (HOSPITAL_BASED_OUTPATIENT_CLINIC_OR_DEPARTMENT_OTHER)
Admission: EM | Admit: 2014-08-28 | Discharge: 2014-08-28 | Disposition: A | Discharge status: Home / Self Care | Payer: Self-pay | Attending: Emergency Medicine | Admitting: Emergency Medicine

## 2014-08-28 DIAGNOSIS — Z791 Long term (current) use of non-steroidal anti-inflammatories (NSAID): Secondary | ICD-10-CM | POA: Insufficient documentation

## 2014-08-28 DIAGNOSIS — G8929 Other chronic pain: Secondary | ICD-10-CM | POA: Insufficient documentation

## 2014-08-28 DIAGNOSIS — M7121 Synovial cyst of popliteal space [Baker], right knee: Secondary | ICD-10-CM | POA: Insufficient documentation

## 2014-08-28 DIAGNOSIS — Z720 Tobacco use: Secondary | ICD-10-CM | POA: Insufficient documentation

## 2014-08-28 DIAGNOSIS — M545 Low back pain, unspecified: Secondary | ICD-10-CM

## 2014-08-28 DIAGNOSIS — Z8709 Personal history of other diseases of the respiratory system: Secondary | ICD-10-CM | POA: Insufficient documentation

## 2014-08-28 HISTORY — DX: Other chronic pain: G89.29

## 2014-08-28 HISTORY — DX: Dorsalgia, unspecified: M54.9

## 2014-08-28 MED ORDER — KETOROLAC TROMETHAMINE 60 MG/2ML IM SOLN
60.0000 mg | Freq: Once | INTRAMUSCULAR | Status: AC
  Administered 2014-08-28: 60 mg via INTRAMUSCULAR
  Filled 2014-08-28: qty 2

## 2014-08-28 MED ORDER — KETOROLAC TROMETHAMINE 10 MG PO TABS: 10.0000 mg | ORAL_TABLET | Freq: Four times a day (QID) | ORAL | Status: DC | PRN

## 2014-08-28 MED ORDER — CYCLOBENZAPRINE HCL 10 MG PO TABS: 10.0000 mg | ORAL_TABLET | Freq: Two times a day (BID) | ORAL | Status: DC | PRN

## 2014-08-28 MED ORDER — CYCLOBENZAPRINE HCL 10 MG PO TABS
10.0000 mg | ORAL_TABLET | Freq: Once | ORAL | Status: AC
  Administered 2014-08-28: 10 mg via ORAL
  Filled 2014-08-28: qty 1

## 2014-08-28 NOTE — ED Notes (Signed)
Back pain. Chronic pain. No injury.

## 2014-08-28 NOTE — ED Provider Notes (Signed)
CSN: [id number]     Arrival date & time 08/28/14  1411 History   First MD Initiated Contact with Patient 08/28/14 1458     Chief Complaint  Patient presents with  . Back Pain     (Consider location/radiation/quality/duration/timing/severity/associated sxs/prior Treatment) HPI Comments: Woke up today with lower back pain and knee pain. R lower back pain, difficulty with bending over, has had prior back injury, no aggravating factors recently. Knee pain has been chronic, history of a Baker's cyst, has knee pain that radiates down into his entire foot. No swelling. Hasn't seen anyone for his Baker's cyst.  Patient is a 24 y.o. male presenting with back pain. The history is provided by the patient.  Back Pain Location:  Sacro-iliac joint Quality:  Aching Radiates to:  Does not radiate Pain severity:  Moderate Onset quality:  Sudden Timing:  Constant Progression:  Unchanged Chronicity:  Recurrent Context: not physical stress and not recent injury (lifts things at work, but didn't feel like he strained his back yesterday)   Relieved by:  Nothing Worsened by:  Nothing tried Associated symptoms: no abdominal pain, no chest pain and no fever     Past Medical History  Diagnosis Date  . Bronchitis   . Upper respiratory infection, acute   . Chronic back pain    Past Surgical History  Procedure Laterality Date  . Eye surgery     Family History  Problem Relation Age of Onset  . Asthma Mother   . Hypertension Mother   . Diabetes Other    History  Substance Use Topics  . Smoking status: Current Every Day Smoker -- 1.50 packs/day    Types: Cigarettes  . Smokeless tobacco: Not on file  . Alcohol Use: No    Review of Systems  Constitutional: Negative for fever.  Respiratory: Negative for cough and shortness of breath.   Cardiovascular: Negative for chest pain and leg swelling.  Gastrointestinal: Negative for vomiting and abdominal pain.  Musculoskeletal: Positive for back  pain.  All other systems reviewed and are negative.     Allergies  Review of patient's allergies indicates no known allergies.  Home Medications   Prior to Admission medications   Medication Sig Start Date End Date Taking? Authorizing Provider  ibuprofen (ADVIL,MOTRIN) 800 MG tablet Take 1 tablet (800 mg total) by mouth 3 (three) times daily. 08/13/14   Benjamin Cartner, PA-C   BP 124/60 mmHg  Pulse 71  Temp(Src) 98.6 F (37 C) (Oral)  Resp 20  Ht  (1.854 m)  Wt 250 lb (113.399 kg)  BMI 32.99 kg/m2  SpO2 99% Physical Exam  Constitutional: He is oriented to person, place, and time. He appears well-developed and well-nourished. No distress.  HENT:  Head: Normocephalic and atraumatic.  Mouth/Throat: No oropharyngeal exudate.  Eyes: EOM are normal. Pupils are equal, round, and reactive to light.  Neck: Normal range of motion. Neck supple.  Cardiovascular: Normal rate and regular rhythm.  Exam reveals no friction rub.   No murmur heard. Pulmonary/Chest: Effort normal and breath sounds normal. No respiratory distress. He has no wheezes. He has no rales.  Abdominal: Soft. He exhibits no distension. There is no tenderness. There is no rebound.  Musculoskeletal: He exhibits no edema.       Right knee: He exhibits decreased range of motion.       Lumbar back: He exhibits decreased range of motion. He exhibits no tenderness, no bony tenderness and no spasm.  Neurological: He is alert  and oriented to person, place, and time.  Skin: Skin is warm. No rash noted. He is not diaphoretic.  Nursing note and vitals reviewed.   ED Course  Procedures (including critical care time) Labs Review Labs Reviewed - No data to display  Imaging Review No results found.   EKG Interpretation None      MDM   Final diagnoses:  Right-sided low back pain without sciatica  Baker's cyst of knee, right    24 year old here with back pain and knee pain. Back pain: Began this morning, pain  with bending over and movement. Denies any saddle anesthesia, urinary incontinence or retention, numbness or tingling in legs. He lifts things at work but doesn't feel like he aggravated his back in the past few days. Denies any red flag symptoms like fever, loss of bowel or bladder control. On exam he locates the pain in the right SI joint. No spasms or tenderness appreciated on exam. We'll give IM Toradol and Flexeril. He is also having knee pain. Knee pain is chronic due to Baker's cyst. It is worse this morning. He has knee pain radiates down into the remainder of his right leg. He denies any swelling. He denies any injury or trauma. Toradol and Flexeril should help with this as well. Has decreased range of motion of the knee, but no swelling or effusion. Feeling better after medications. Given by mouth Toradol and Flexeril to go home. Given sportsman follow-up for his Baker cyst in his back pain. Stable for discharge.  Elwin MochaBlair Tahisha Hakim, MD 08/28/14 [phone]

## 2014-08-28 NOTE — Discharge Instructions (Signed)
Back Injury Prevention Back injuries can be extremely painful and difficult to heal. After having one back injury, you are much more likely to experience another later on. It is important to learn how to avoid injuring or re-injuring your back. The following tips can help you to prevent a back injury. PHYSICAL FITNESS  Exercise regularly and try to develop good tone in your abdominal muscles. Your abdominal muscles provide a lot of the support needed by your back.  Do aerobic exercises (walking, jogging, biking, swimming) regularly.  Do exercises that increase balance and strength (tai chi, yoga) regularly. This can decrease your risk of falling and injuring your back.  Stretch before and after exercising.  Maintain a healthy weight. The more you weigh, the more stress is placed on your back. For every pound of weight, 10 times that amount of pressure is placed on the back. DIET  Talk to your caregiver about how much calcium and vitamin D you need per day. These nutrients help to prevent weakening of the bones (osteoporosis). Osteoporosis can cause broken (fractured) bones that lead to back pain.  Include good sources of calcium in your diet, such as dairy products, green, leafy vegetables, and products with calcium added (fortified).  Include good sources of vitamin D in your diet, such as milk and foods that are fortified with vitamin D.  Consider taking a nutritional supplement or a multivitamin if needed.  Stop smoking if you smoke. POSTURE  Sit and stand up straight. Avoid leaning forward when you sit or hunching over when you stand.  Choose chairs with good low back (lumbar) support.  If you work at a desk, sit close to your work so you do not need to lean over. Keep your chin tucked in. Keep your neck drawn back and elbows bent at a right angle. Your arms should look like the letter "L."  Sit high and close to the steering wheel when you drive. Add a lumbar support to your car  seat if needed.  Avoid sitting or standing in one position for too long. Take breaks to get up, stretch, and walk around at least once every hour. Take breaks if you are driving for long periods of time.  Sleep on your side with your knees slightly bent, or sleep on your back with a pillow under your knees. Do not sleep on your stomach. LIFTING, TWISTING, AND REACHING  Avoid heavy lifting, especially repetitive lifting. If you must do heavy lifting:  Stretch before lifting.  Work slowly.  Rest between lifts.  Use carts and dollies to move objects when possible.  Make several small trips instead of carrying 1 heavy load.  Ask for help when you need it.  Ask for help when moving big, awkward objects.  Follow these steps when lifting:  Stand with your feet shoulder-width apart.  Get as close to the object as you can. Do not try to pick up heavy objects that are far from your body.  Use handles or lifting straps if they are available.  Bend at your knees. Squat down, but keep your heels off the floor.  Keep your shoulders pulled back, your chin tucked in, and your back straight.  Lift the object slowly, tightening the muscles in your legs, abdomen, and buttocks. Keep the object as close to the center of your body as possible.  When you put a load down, use these same guidelines in reverse.  Do not:  Lift the object above your waist.  Twist at the waist while lifting or carrying a load. Move your feet if you need to turn, not your waist.  Bend over without bending at your knees.  Avoid reaching over your head, across a table, or for an object on a high surface. OTHER TIPS  Avoid wet floors and keep sidewalks clear of ice to prevent falls.  Do not sleep on a mattress that is too soft or too hard.  Keep items that are used frequently within easy reach.  Put heavier objects on shelves at waist level and lighter objects on lower or higher shelves.  Find ways to  decrease your stress, such as exercise, massage, or relaxation techniques. Stress can build up in your muscles. Tense muscles are more vulnerable to injury.  Seek treatment for depression or anxiety if needed. These conditions can increase your risk of developing back pain. SEEK MEDICAL CARE IF:  You injure your back.  You have questions about diet, exercise, or other ways to prevent back injuries. MAKE SURE YOU:  Understand these instructions.  Will watch your condition.  Will get help right away if you are not doing well or get worse. Document Released: 06/02/2004 Document Revised: 07/18/2011 Document Reviewed: 06/06/2011 [facility] Patient Information 2015 [place], [place]. This information is not intended to replace advice given to you by your health care provider. Make sure you discuss any questions you have with your health care provider.  Baker Cyst A Baker cyst is a sac-like structure that forms in the back of the knee. It is filled with the same fluid that is located in your knee. This fluid lubricates the bones and cartilage of the knee and allows them to move over each other more easily. CAUSES  When the knee becomes injured or inflamed, increased fluid forms in the knee. When this happens, the joint lining is pushed out behind the knee and forms the Baker cyst. This cyst may also be caused by inflammation from arthritic conditions and infections. SIGNS AND SYMPTOMS  A Baker cyst usually has no symptoms. When the cyst is substantially enlarged:  You may feel pressure behind the knee, stiffness in the knee, or a mass in the area behind the knee.  You may develop pain, redness, and swelling in the calf. This can suggest a blood clot and requires evaluation by your health care provider. DIAGNOSIS  A Baker cyst is most often found during an ultrasound exam. This exam may have been performed for other reasons, and the cyst was found incidentally. Sometimes an MRI is used. This picks  up other problems within a joint that an ultrasound exam may not. If the Baker cyst developed immediately after an injury, X-ray exams may be used to diagnose the cyst. TREATMENT  The treatment depends on the cause of the cyst. Anti-inflammatory medicines and rest often will be prescribed. If the cyst is caused by a bacterial infection, antibiotic medicines may be prescribed.  HOME CARE INSTRUCTIONS   If the cyst was caused by an injury, for the first 24 hours, keep the injured leg elevated on 2 pillows while lying down.  For the first 24 hours while you are awake, apply ice to the injured area:  Put ice in a plastic bag.  Place a towel between your skin and the bag.  Leave the ice on for 20 minutes, 2-3 times a day.  Only take over-the-counter or prescription medicines for pain, discomfort, or fever as directed by your health care provider.  Only take antibiotic  medicine as directed. Make sure to finish it even if you start to feel better. MAKE SURE YOU:   Understand these instructions.  Will watch your condition.  Will get help right away if you are not doing well or get worse. Document Released: 04/25/2005 Document Revised: 02/13/2013 Document Reviewed: 12/05/2012 [facility] Patient Information 2015 [place], [place]. This information is not intended to replace advice given to you by your health care provider. Make sure you discuss any questions you have with your health care provider.

## 2014-09-02 ENCOUNTER — Encounter: Payer: Self-pay | Admitting: Family Medicine

## 2014-09-02 ENCOUNTER — Ambulatory Visit (INDEPENDENT_AMBULATORY_CARE_PROVIDER_SITE_OTHER): Payer: Self-pay | Admitting: Family Medicine

## 2014-09-02 VITALS — BP 146/69 | HR 92 | Ht 73.0 in | Wt 245.0 lb

## 2014-09-02 DIAGNOSIS — M25561 Pain in right knee: Secondary | ICD-10-CM

## 2014-09-02 MED ORDER — METHYLPREDNISOLONE ACETATE 40 MG/ML IJ SUSP
40.0000 mg | Freq: Once | INTRAMUSCULAR | Status: AC
  Administered 2014-09-02: 40 mg via INTRA_ARTICULAR

## 2014-09-02 NOTE — Patient Instructions (Signed)
A baker's cyst is usually not the cause of pain - more commonly it's from the thing causing a cyst. If you have a cyst it's very small currently as I can't identify it easily on ultrasound. I suspect your pain is from a meniscus tear that causes swelling of your knee. You were given a cortisone shot today. Get the cone coverage - call us when this goes through with an update on your status and if you're not improving I would recommend an MRI of this knee. Aleve 2 tabs twice a day with food. Tylenol 500mg  1-2 tabs three times a day as needed for pain as well.

## 2014-09-03 NOTE — Progress Notes (Signed)
PCP: Kerby NoraAmy Bedsole, MD  Subjective:   HPI: Patient is a 24 y.o. male here for right knee pain.  Patient reports having pain in right knee for about 4 years. 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 Pain mainly posterior and radiates up, down leg. Never had imaging for this. Told in past it was likely a cyst. Difficulty straightening leg for years now. No catching, giving out.  Past Medical History  Diagnosis Date  . Bronchitis   . Upper respiratory infection, acute   . Chronic back pain     Current Outpatient Prescriptions on File Prior to Visit  Medication Sig Dispense Refill  . cyclobenzaprine (FLEXERIL) 10 MG tablet Take 1 tablet (10 mg total) by mouth 2 (two) times daily as needed for muscle spasms. 20 tablet 0  . ibuprofen (ADVIL,MOTRIN) 800 MG tablet Take 1 tablet (800 mg total) by mouth 3 (three) times daily. 21 tablet 0  . ketorolac (TORADOL) 10 MG tablet Take 1 tablet (10 mg total) by mouth every 6 (six) hours as needed. 20 tablet 0   No current facility-administered medications on file prior to visit.    Past Surgical History  Procedure Laterality Date  . Eye surgery      No Known Allergies  History   Social History  . Marital Status: Single    Spouse Name: N/A  . Number of Children: N/A  . Years of Education: N/A   Occupational History  . Not on file.   Social History Main Topics  . Smoking status: Current Every Day Smoker -- 1.50 packs/day    Types: Cigarettes  . Smokeless tobacco: Not on file  . Alcohol Use: No  . Drug Use: No  . Sexual Activity: Not on file   Other Topics Concern  . Not on file   Social History Narrative    Family History  Problem Relation Age of Onset  . Asthma Mother   . Hypertension Mother   . Diabetes Other     BP 146/69 mmHg  Pulse 92  Ht 6\' 1"  (1.854 m)  Wt 245 lb (111.131 kg)  BMI 32.33 kg/m2  Review of Systems: See HPI above.    Objective:  Physical  Exam:  Gen: NAD  Right knee: No gross deformity, ecchymoses, swelling.  No palpable bakers cyst.  No effusion. Minimal tenderness medial joint line, posterior knee. Unable to extend knee fully - lacks about 20 degrees.  Full flexion. Negative ant/post drawers. Negative valgus/varus testing. Negative lachmanns. Mild pain with mcmurrays, apleys.  Negative patellar apprehension. NV intact distally.    MSK u/s:  No evidence right knee effusion or bakers cyst.  Assessment & Plan:  1. Right knee pain - Patient has been told in past he likely has a bakers cyst causing his posterior knee pain but I do not see one on ultrasound and these typically are not the source of pain unless they are very large.  More consistent with a meniscus tear he would have sustained about 4 years ago and concerning he cannot fully extend his knee - either from a tear or flexion contracture.  Given intraarticular injection today.  Shown basic ROM exercises to help regain full extension.  Also given cone coverage because if he's not improving over the next couple weeks I would recommend MRI to assess for meniscus tear.  Aleve, tylenol in meantime.  After informed written consent, patient was seated on exam table. Right knee  was prepped with alcohol swab and utilizing anteromedial approach, patient's right knee was injected intraarticularly with 3:1 marcaine: depomedrol. Patient tolerated the procedure well without immediate complications.

## 2014-09-04 DIAGNOSIS — M25561 Pain in right knee: Secondary | ICD-10-CM | POA: Insufficient documentation

## 2014-09-04 NOTE — Assessment & Plan Note (Signed)
Patient has been told in past he likely has a bakers cyst causing his posterior knee pain but I do not see one on ultrasound and these typically are not the source of pain unless they are very large.  More consistent with a meniscus tear he would have sustained about 4 years ago and concerning he cannot fully extend his knee - either from a tear or flexion contracture.  Given intraarticular injection today.  Shown basic ROM exercises to help regain full extension.  Also given cone coverage because if he's not improving over the next couple weeks I would recommend MRI to assess for meniscus tear.  Aleve, tylenol in meantime.  After informed written consent, patient was seated on exam table. Right knee was prepped with alcohol swab and utilizing anteromedial approach, patient's right knee was injected intraarticularly with 3:1 marcaine: depomedrol. Patient tolerated the procedure well without immediate complications.

## 2015-03-05 ENCOUNTER — Inpatient Hospital Stay
Admit: 2015-03-05 | Discharge: 2015-03-06 | Disposition: A | Discharge status: Home / Self Care | Payer: Self-pay | Attending: Emergency Medicine

## 2015-03-05 DIAGNOSIS — M25561 Pain in right knee: Secondary | ICD-10-CM

## 2015-03-05 LAB — CBC WITH AUTOMATED DIFF
ABS. BASOPHILS: 0 10*3/uL (ref 0.0–0.1)
ABS. EOSINOPHILS: 0.1 10*3/uL (ref 0.0–0.4)
ABS. LYMPHOCYTES: 1.9 10*3/uL (ref 0.8–3.5)
ABS. MONOCYTES: 0.8 10*3/uL (ref 0.0–1.0)
ABS. NEUTROPHILS: 6.2 10*3/uL (ref 1.8–8.0)
BASOPHILS: 0 % (ref 0–1)
EOSINOPHILS: 1 % (ref 0–7)
HCT: 41.3 % (ref 36.6–50.3)
HGB: 14.1 g/dL (ref 12.1–17.0)
LYMPHOCYTES: 21 % (ref 12–49)
MCH: 28.7 PG (ref 26.0–34.0)
MCHC: 34.1 g/dL (ref 30.0–36.5)
MCV: 83.9 FL (ref 80.0–99.0)
MONOCYTES: 9 % (ref 5–13)
NEUTROPHILS: 69 % (ref 32–75)
PLATELET: 176 10*3/uL (ref 150–400)
RBC: 4.92 M/uL (ref 4.10–5.70)
RDW: 12.8 % (ref 11.5–14.5)
WBC: 8.9 10*3/uL (ref 4.1–11.1)

## 2015-03-05 MED ORDER — HYDROMORPHONE (PF) 1 MG/ML IJ SOLN
1 mg/mL | INTRAMUSCULAR | Status: AC
Start: 2015-03-05 — End: 2015-03-05
  Administered 2015-03-05: 23:00:00 via INTRAMUSCULAR

## 2015-03-05 MED ORDER — ONDANSETRON 4 MG TAB, RAPID DISSOLVE
4 mg | ORAL | Status: AC
Start: 2015-03-05 — End: 2015-03-05
  Administered 2015-03-05: 23:00:00 via ORAL

## 2015-03-05 MED FILL — ONDANSETRON 4 MG TAB, RAPID DISSOLVE: 4 mg | ORAL | Qty: 1

## 2015-03-05 MED FILL — HYDROMORPHONE (PF) 1 MG/ML IJ SOLN: 1 mg/mL | INTRAMUSCULAR | Qty: 1

## 2015-03-05 NOTE — ED Provider Notes (Signed)
HPI Comments: Cody Christensen is a 24 y.o. male presenting to ED c/o acute on chronic constant 10/10 aching/throbbing/numb right leg pain that has been intermittent for 6 years, but has been worse within the last week. Pt states that he woke up with the pain. Pt states that the pain has been getting progressively worse within the last three days. Pt states that he has been unable to sit to have a bowel movement. Pt states that he has been seen at [place] ED two times within the last three days, where he had normal ultrasound and blood work results. Pt states that he has been given hydrocodone by [facility]a [facility], with no relief. Pt states that he has also tried Ibuprofen with no relief. Pt states that he has a knee brace, which often helps for his pain, but has recently been giving him no relief. Pt notes that he has an appointment with an orthopedics physician on 03/18/15. Pt specifically denies any injury, fevers, chills, nausea, vomiting, diarrhea, abdominal pain, chest pain, shortness of breath, dysuria, hematuria or difficulty urinating.     Social Hx: - smoking, - alcohol use, - illicit drug use      There are no other complaints, changes, or physical findings at this time.  Written by Blaine Hamper Marijean Bravo, ED Scribe, as dictated by Noralee Chars, PA-C.         The history is provided by the patient. No language interpreter was used.        History reviewed. No pertinent past medical history.    History reviewed. No pertinent past surgical history.      History reviewed. No pertinent family history.    Social History     Social History   ??? Marital status: SINGLE     Spouse name: N/A   ??? Number of children: N/A   ??? Years of education: N/A     Occupational History   ??? Not on file.     Social History Main Topics   ??? Smoking status: Former Smoker   ??? Smokeless tobacco: Not on file   ??? Alcohol use No   ??? Drug use: No   ??? Sexual activity: Not on file     Other Topics Concern   ??? Not on file     Social History Narrative    ??? No narrative on file         ALLERGIES: Review of patient's allergies indicates no known allergies.    Review of Systems   Constitutional: Negative.  Negative for chills and fever.   Respiratory: Negative.  Negative for cough, chest tightness, shortness of breath and wheezing.    Cardiovascular: Negative.  Negative for chest pain and palpitations.   Gastrointestinal: Negative.  Negative for abdominal pain, constipation, diarrhea, nausea and vomiting.   Musculoskeletal: Positive for myalgias (right leg). Negative for arthralgias.   Skin: Negative.  Negative for rash.   Allergic/Immunologic: Negative.  Negative for environmental allergies and food allergies.   Neurological: Positive for numbness (right leg). Negative for headaches.   All other systems reviewed and are negative.      Vitals:    03/05/15 1830 03/05/15 2014   BP: (!) 165/118 133/69   Pulse: 100 72   Resp:  16   Temp: 97.4 ??F (36.3 ??C)    SpO2: 99% 97%   Weight: 108.9 kg (240 lb)    Height: 6' 2"  (1.88 m)             Physical  Exam   Constitutional: He is oriented to person, place, and time. He appears well-developed and well-nourished. No distress.   Pt appears in moderate discomfort, awake and alert in mild distress secondary to pain.    HENT:   Head: Normocephalic and atraumatic.   Right Ear: External ear normal.   Left Ear: External ear normal.   Nose: Nose normal.   Eyes: Conjunctivae and EOM are normal. Pupils are equal, round, and reactive to light. Right eye exhibits no discharge. Left eye exhibits no discharge.   Neck: Normal range of motion.   Cardiovascular: Normal rate, normal heart sounds and intact distal pulses.    Pulmonary/Chest: Effort normal and breath sounds normal. No respiratory distress. He has no wheezes. He has no rales. He exhibits no tenderness.   Abdominal: Soft. Bowel sounds are normal. There is no tenderness. There is no guarding.   No CVA tenderness b/l.   Musculoskeletal: Normal range of motion. He exhibits tenderness. He  exhibits no edema or deformity.   R knee: No edema, erythema, or obvious bony deformity. No increase in warmth. + TTP LCL, posterior hamstring, and posterior knee joint. ROM intact. Subjective numbness to knee.   2+ D.P. Pulses b/l.   Neurological: He is alert and oriented to person, place, and time. No cranial nerve deficit.   No focal neuro deficits.    Skin: Skin is warm and dry. No rash noted. He is not diaphoretic. No erythema. No pallor.   Psychiatric: He has a normal mood and affect. His behavior is normal.   Nursing note and vitals reviewed.       MDM  Number of Diagnoses or Management Options  Right knee pain, unspecified chronicity:   Diagnosis management comments: DDx: muscle cramp, strain, sprain, electrolyte abnormality, doubtfully DVT due to negative [place] last night    Reviewed knee xray wnl from [facility].     Can not r/out internal derangement advised for follow up with ortho       Amount and/or Complexity of Data Reviewed  Clinical lab tests: ordered and reviewed  Review and summarize past medical records: yes    Patient Progress  Patient progress: stable    ED Course       Procedures    Progress Note:  8:01 PM  Pt re-evaluated. Pt states that his pain is improved.  Written by Blaine Hamper Marijean Bravo, ED Scribe, as dictated by Noralee Chars, PA-C.    Progress Note:  8:25 PM  Pt re-evaluated. Pt has requested to be discharged so that he can go to [facility] clinic for a steroid injection before they close tn at 9:00.   Written by Blaine Hamper [employer], ED Scribe, as dictated by Noralee Chars, PA-C.      LABORATORY TESTS:  Recent Results (from the past 12 hour(s))   CBC WITH AUTOMATED DIFF    Collection Time: 03/05/15  7:25 PM   Result Value Ref Range    WBC 8.9 4.1 - 11.1 K/uL    RBC 4.92 4.10 - 5.70 M/uL    HGB 14.1 12.1 - 17.0 g/dL    HCT 41.3 36.6 - 50.3 %    MCV 83.9 80.0 - 99.0 FL    MCH 28.7 26.0 - 34.0 PG    MCHC 34.1 30.0 - 36.5 g/dL    RDW 12.8 11.5 - 14.5 %    PLATELET 176 150 - 400 K/uL     NEUTROPHILS 69 32 - 75 %    LYMPHOCYTES 21  12 - 49 %    MONOCYTES 9 5 - 13 %    EOSINOPHILS 1 0 - 7 %    BASOPHILS 0 0 - 1 %    ABS. NEUTROPHILS 6.2 1.8 - 8.0 K/UL    ABS. LYMPHOCYTES 1.9 0.8 - 3.5 K/UL    ABS. MONOCYTES 0.8 0.0 - 1.0 K/UL    ABS. EOSINOPHILS 0.1 0.0 - 0.4 K/UL    ABS. BASOPHILS 0.0 0.0 - 0.1 K/UL   METABOLIC PANEL, COMPREHENSIVE    Collection Time: 03/05/15  7:25 PM   Result Value Ref Range    Sodium 140 136 - 145 mmol/L    Potassium 3.6 3.5 - 5.1 mmol/L    Chloride 107 97 - 108 mmol/L    CO2 22 21 - 32 mmol/L    Anion gap 11 5 - 15 mmol/L    Glucose 97 65 - 100 mg/dL    BUN 20 6 - 20 MG/DL    Creatinine 1.06 0.70 - 1.30 MG/DL    BUN/Creatinine ratio 19 12 - 20      GFR est AA >60 >60 ml/min/1.19m    GFR est non-AA >60 >60 ml/min/1.773m   Calcium 9.5 8.5 - 10.1 MG/DL    Bilirubin, total 0.7 0.2 - 1.0 MG/DL    ALT 46 12 - 78 U/L    AST 24 15 - 37 U/L    Alk. phosphatase 49 45 - 117 U/L    Protein, total 7.7 6.4 - 8.2 g/dL    Albumin 4.3 3.5 - 5.0 g/dL    Globulin 3.4 2.0 - 4.0 g/dL    A-G Ratio 1.3 1.1 - 2.2     MAGNESIUM    Collection Time: 03/05/15  7:25 PM   Result Value Ref Range    Magnesium 2.4 1.6 - 2.4 mg/dL     MEDICATIONS GIVEN:  Medications   diazePAM (VALIUM) tablet 5 mg (not administered)   HYDROmorphone (PF) (DILAUDID) injection 1 mg (1 mg IntraMUSCular Given 03/05/15 1914)   ondansetron (ZOFRAN ODT) tablet 4 mg (4 mg Oral Given 03/05/15 1914)       IMPRESSION:  1. Right knee pain, unspecified chronicity        PLAN:  1.   Current Discharge Medication List      START taking these medications    Details   oxyCODONE-acetaminophen (PERCOCET) 5-325 mg per tablet Take 1 Tab by mouth every six (6) hours as needed for Pain for up to 3 days. Max Daily Amount: 4 Tabs.  Qty: 15 Tab, Refills: 0      naproxen (NAPROSYN) 500 mg tablet Take 1 Tab by mouth two (2) times daily (with meals) for 10 days.  Qty: 20 Tab, Refills: 0       cyclobenzaprine (FLEXERIL) 5 mg tablet Take 2 Tabs by mouth three (3) times daily as needed for Muscle Spasm(s).  Qty: 20 Tab, Refills: 0         STOP taking these medications       ibuprofen (MOTRIN IB) 200 mg tablet Comments:   Reason for Stopping:         HYDROcodone-acetaminophen (NOSlaughter5-325 mg per tablet Comments:   Reason for Stopping:             2.   Follow-up Information     Follow up With Details Comments [place] soon as possible for a visit as scheduled [address]  [place]  [phone]        3. RICE    Return to ED if worse   Discharge Note:  8:28 PM  The patient is ready for discharge. The patient's signs, symptoms, diagnosis, and discharge instructions have been discussed and the patient and/or family has conveyed their understanding. The patient and/or family is to follow up as recommended or return to the ER should their symptoms worsen. Plan has been discussed and the patient and/or family is in agreement.   Written by Blaine Hamper Marijean Bravo, ED Scribe, as dictated by Noralee Chars, PA-C.     Attestation:  This note is prepared by Jerene Pitch A. Ford, acting as Education administrator for [employer].    Noralee Chars, PA-C: The scribe's documentation has been prepared under my direction and personally reviewed by me in its entirety. I confirm that the note above accurately reflects all work, treatment, procedures, and medical decision making performed by me.

## 2015-03-05 NOTE — ED Notes (Signed)
Pt returned to ED stating he was supposed to receive an immobilizer and crutches prior to discharge.  Pt supplied with items.  Instructions given regarding usage and precautions.

## 2015-03-05 NOTE — ED Notes (Signed)
Patient states pain has improved, rating it a 5/10.

## 2015-03-05 NOTE — ED Notes (Signed)
Lacey, PA reviewed discharge instructions with the patient.  The patient verbalized understanding.

## 2015-03-06 LAB — METABOLIC PANEL, COMPREHENSIVE
A-G Ratio: 1.3 (ref 1.1–2.2)
ALT (SGPT): 46 U/L (ref 12–78)
AST (SGOT): 24 U/L (ref 15–37)
Albumin: 4.3 g/dL (ref 3.5–5.0)
Alk. phosphatase: 49 U/L (ref 45–117)
Anion gap: 11 mmol/L (ref 5–15)
BUN/Creatinine ratio: 19 (ref 12–20)
BUN: 20 MG/DL (ref 6–20)
Bilirubin, total: 0.7 MG/DL (ref 0.2–1.0)
CO2: 22 mmol/L (ref 21–32)
Calcium: 9.5 MG/DL (ref 8.5–10.1)
Chloride: 107 mmol/L (ref 97–108)
Creatinine: 1.06 MG/DL (ref 0.70–1.30)
GFR est AA: >60 mL/min/{1.73_m2} (ref >60)
GFR est non-AA: >60 mL/min/{1.73_m2} (ref >60)
Globulin: 3.4 g/dL (ref 2.0–4.0)
Glucose: 97 mg/dL (ref 65–100)
Potassium: 3.6 mmol/L (ref 3.5–5.1)
Protein, total: 7.7 g/dL (ref 6.4–8.2)
Sodium: 140 mmol/L (ref 136–145)

## 2015-03-06 LAB — MAGNESIUM: Magnesium: 2.4 mg/dL (ref 1.6–2.4)

## 2015-03-06 MED ORDER — CYCLOBENZAPRINE 5 MG TAB
5 mg | ORAL_TABLET | Freq: Three times a day (TID) | ORAL | 0 refills | Status: DC | PRN
Start: 2015-03-06 — End: 2016-06-08

## 2015-03-06 MED ORDER — NAPROXEN 500 MG TAB
500 mg | ORAL_TABLET | Freq: Two times a day (BID) | ORAL | 0 refills | Status: AC
Start: 2015-03-06 — End: 2015-03-15

## 2015-03-06 MED ORDER — DIAZEPAM 5 MG TAB
5 mg | ORAL | Status: DC
Start: 2015-03-06 — End: 2015-03-06

## 2015-03-06 MED ORDER — OXYCODONE-ACETAMINOPHEN 5 MG-325 MG TAB
5-325 mg | ORAL_TABLET | Freq: Four times a day (QID) | ORAL | 0 refills | Status: AC | PRN
Start: 2015-03-06 — End: 2015-03-08

## 2016-06-08 ENCOUNTER — Inpatient Hospital Stay
Admit: 2016-06-08 | Discharge: 2016-06-08 | Disposition: A | Discharge status: Home / Self Care | Payer: Self-pay | Attending: Emergency Medicine

## 2016-06-08 DIAGNOSIS — M5441 Lumbago with sciatica, right side: Secondary | ICD-10-CM

## 2016-06-08 MED ORDER — METHOCARBAMOL 500 MG TAB
500 mg | ORAL_TABLET | Freq: Four times a day (QID) | ORAL | 0 refills | Status: AC | PRN
Start: 2016-06-08 — End: ?

## 2016-06-08 MED ORDER — ACETAMINOPHEN 500 MG TAB
500 mg | ORAL_TABLET | Freq: Four times a day (QID) | ORAL | 0 refills | Status: AC | PRN
Start: 2016-06-08 — End: ?

## 2016-06-08 MED ORDER — KETOROLAC TROMETHAMINE 30 MG/ML INJECTION
30 mg/mL (1 mL) | INTRAMUSCULAR | Status: AC
Start: 2016-06-08 — End: 2016-06-08
  Administered 2016-06-08: 17:00:00 via INTRAMUSCULAR

## 2016-06-08 MED ORDER — NAPROXEN 500 MG TAB
500 mg | ORAL_TABLET | Freq: Two times a day (BID) | ORAL | 0 refills | Status: AC | PRN
Start: 2016-06-08 — End: ?

## 2016-06-08 MED FILL — KETOROLAC TROMETHAMINE 30 MG/ML INJECTION: 30 mg/mL (1 mL) | INTRAMUSCULAR | Qty: 1

## 2016-06-08 NOTE — ED Notes (Signed)
PA has reviewed discharge instructions with the patient.  The patient verbalized understanding. Patient discharged home ambulatory.

## 2016-06-08 NOTE — ED Provider Notes (Signed)
EMERGENCY DEPARTMENT HISTORY AND PHYSICAL EXAM      Date: 06/08/2016  Patient Name: Cody Christensen    History of Presenting Illness     Chief Complaint   Patient presents with   ??? Back Pain     pt c/o lower back pain that radiates into right leg x2 weeks.  Pt denies any injury.  Hx of sciatic back pain.        History Provided By: Patient    HPI: Cody Christensen, 26 y.o. male with PMHx of chronic back pain presents ambulatory to the ED with cc of an acute onset of severe lower back pain with radiation to R leg x over one week. Pt reports onset was sudden and occurred when pt was playfully throwing his child into the air. He notes having an intermittent burning sensation and numbness/tingling in his R leg. He also notes having intermittent diaphoresis and nausea w/ pain on movement of back lasting less than 15 mins at a time. Pt reports feeling short of breath when he is ambulating and pain is present. He reports being evaluated at a different ED, was diagnosed with sciatica and prescribed prednisone and naproxen; approx 10 days pta. Pt reports he is still taking the prednisone and has run out of the naproxen. He notes minimal relief with both medicines. He also notes taking tylenol PM without relief and states he has been unable to sleep due to the pain. He reports having cortisone injections in the past year for the same pain in his Lt knee. Pt specifically denies any fevers, chills, sore throat, rhinorrhea, cough, SOB, CP, abdominal pain, diarrhea, constipation, incontinence, dysuria, hematuria, rashes, and HA.    Social hx: -(former) Tobacco, -EtOH, -Drugs    PCP: None    There are no other complaints, changes, or physical findings at this time.    Current Outpatient Prescriptions   Medication Sig Dispense Refill   ??? naproxen (NAPROSYN) 500 mg tablet Take 1 Tab by mouth two (2) times daily as needed. 20 Tab 0   ??? acetaminophen (TYLENOL) 500 mg tablet Take 2 Tabs by mouth every six (6) hours as needed for Pain. 20 Tab 0    ??? methocarbamol (ROBAXIN) 500 mg tablet Take 1 Tab by mouth every six (6) hours as needed (mm spasm). 20 Tab 0       Past History     Past Medical History:  History reviewed. No pertinent past medical history.    Past Surgical History:  History reviewed. No pertinent surgical history.    Family History:  History reviewed. No pertinent family history.    Social History:  Social History   Substance Use Topics   ??? Smoking status: Current Every Day Smoker     Packs/day: 1.00   ??? Smokeless tobacco: Never Used   ??? Alcohol use No       Allergies:  No Known Allergies      Review of Systems   Review of Systems   Constitutional: Positive for diaphoresis. Negative for chills and fever.   HENT: Negative for congestion, rhinorrhea, sneezing and sore throat.    Eyes: Negative for redness and visual disturbance.   Respiratory: Negative for shortness of breath.    Cardiovascular: Negative for chest pain and leg swelling.   Gastrointestinal: Positive for nausea. Negative for abdominal pain, constipation, diarrhea and vomiting.        Negative for incontinence.   Genitourinary: Negative for difficulty urinating and frequency.  Negative for incontinence.   Musculoskeletal: Positive for back pain (lower) and myalgias (R leg). Negative for neck stiffness.   Skin: Negative for rash.   Neurological: Positive for numbness (R leg). Negative for dizziness, syncope, weakness and headaches.   Hematological: Negative for adenopathy.   All other systems reviewed and are negative.      Physical Exam   Physical Exam   Constitutional: He is oriented to person, place, and time. He appears well-developed and well-nourished.   Well appearing male, sitting in chair leaning to the left. Pt is in mild distress.    HENT:   Head: Normocephalic and atraumatic.   Mouth/Throat: Oropharynx is clear and moist and mucous membranes are normal.   Eyes: EOM are normal.   Neck: Normal range of motion and full passive range of motion without  pain. Neck supple.   Cardiovascular: Normal rate, regular rhythm, normal heart sounds, intact distal pulses and normal pulses.    No murmur heard.  Pulmonary/Chest: Effort normal and breath sounds normal. No respiratory distress. He exhibits no tenderness.   Abdominal: Soft. Normal appearance and bowel sounds are normal. There is no tenderness. There is no rebound and no guarding.   Musculoskeletal:        Cervical back: Normal.        Thoracic back: Normal.        Lumbar back: He exhibits tenderness and pain. He exhibits normal range of motion, no bony tenderness, no swelling, no edema, no deformity, no laceration, no spasm and normal pulse.        Back:    No T and L spinal TTP. Moderate paravertebral spine TTP. No obvious deformities. No step off. No crepitus. NVI.    Neurological: He is alert and oriented to person, place, and time. He has normal strength.   Skin: Skin is warm, dry and intact. No rash noted. No erythema.   Psychiatric: He has a normal mood and affect. His speech is normal and behavior is normal. Judgment and thought content normal.   Nursing note and vitals reviewed.        Diagnostic Study Results     Labs -   No results found for this or any previous visit (from the past 12 hour(s)).    Radiologic Studies -   No orders to display         Medical Decision Making   I am the first provider for this patient.    I reviewed the vital signs, available nursing notes, past medical history, past surgical history, family history and social history.    Vital Signs-Reviewed the patient's vital signs.  Patient Vitals for the past 12 hrs:   Temp Pulse Resp BP SpO2   06/08/16 1026 97.9 ??F (36.6 ??C) 77 16 153/86 100 %       Records Reviewed: Old Medical Records    Provider Notes (Medical Decision Making):   DDx: herniated disc, sciatica, strain, sprain; fracture and dislocation unlikely given hx.     ED Course:   Initial assessment performed. The patients presenting problems have been  discussed, and they are in agreement with the care plan formulated and outlined with them.  I have encouraged them to ask questions as they arise throughout their visit.    Discussed with patient risks and benefits of xray for new back pain. Explained new guidelines to treat with trial of medications if there are no red flag signs or symptoms. Follow up with ortho and/or PCP if  symptoms continue and/or worsen. Reviewed red flag symptoms (including saddle numbness, tingling, weakness, hematuria, chest pain, abdominal pain, n/v, fever) and to return to ED in the case of any. Will do Toradol IM today in ED. Pt endorses understanding an will follow up with Ortho/PCP.    Critical Care Time:   0    Disposition:  DISCHARGE NOTE  11:51 AM  The patient has been re-evaluated and is ready for discharge. Reviewed available results with patient. Counseled pt on diagnosis and care plan. Pt has expressed understanding, and all questions have been answered. Pt agrees with plan and agrees to F/U as recommended, or return to the ED if their sxs worsen. Discharge instructions have been provided and explained to the pt, along with reasons to return to the ED.        PLAN:  1.   Discharge Medication List as of 06/08/2016 11:49 AM      START taking these medications    Details   naproxen (NAPROSYN) 500 mg tablet Take 1 Tab by mouth two (2) times daily as needed., Print, Disp-20 Tab, R-0      acetaminophen (TYLENOL) 500 mg tablet Take 2 Tabs by mouth every six (6) hours as needed for Pain., Print, Disp-20 Tab, R-0      methocarbamol (ROBAXIN) 500 mg tablet Take 1 Tab by mouth every six (6) hours as needed (mm spasm)., Print, Disp-20 Tab, R-0         CONTINUE these medications which have NOT CHANGED    Details   cyclobenzaprine (FLEXERIL) 5 mg tablet Take 2 Tabs by mouth three (3) times daily as needed for Muscle Spasm(s)., Print, Disp-20 Tab, R-0           2.   Follow-up Information     Follow up With Details Comments Contact Info     [facility] Schedule an appointment as soon as possible for a visit in 1 week As needed, If symptoms worsen [address]  [address]  [place] [address]  [phone]    [facility] Schedule an appointment as soon as possible for a visit in 1 week As needed, If symptoms worsen [address]  [place] [address]  [phone]        Return to ED if worse     Diagnosis     Clinical Impression:   1. Acute right-sided low back pain with right-sided sciatica        Attestations:    This note is prepared by Lurene Shadow, acting as Neurosurgeon for [employer], PA-C.    The scribe's documentation has been prepared under my direction and personally reviewed by me in its entirety. I confirm that the note above accurately reflects all work, treatment, procedures, and medical decision making performed by me.  Hermine Messick, PA-C.

## 2016-10-13 ENCOUNTER — Encounter (HOSPITAL_BASED_OUTPATIENT_CLINIC_OR_DEPARTMENT_OTHER): Payer: Self-pay | Admitting: Respiratory Therapy

## 2016-10-13 ENCOUNTER — Emergency Department (HOSPITAL_BASED_OUTPATIENT_CLINIC_OR_DEPARTMENT_OTHER)
Admission: EM | Admit: 2016-10-13 | Discharge: 2016-10-13 | Disposition: A | Discharge status: Home / Self Care | Payer: Self-pay | Attending: Emergency Medicine | Admitting: Emergency Medicine

## 2016-10-13 ENCOUNTER — Emergency Department (HOSPITAL_BASED_OUTPATIENT_CLINIC_OR_DEPARTMENT_OTHER): Payer: Self-pay

## 2016-10-13 DIAGNOSIS — F17210 Nicotine dependence, cigarettes, uncomplicated: Secondary | ICD-10-CM | POA: Insufficient documentation

## 2016-10-13 DIAGNOSIS — J069 Acute upper respiratory infection, unspecified: Secondary | ICD-10-CM | POA: Insufficient documentation

## 2016-10-13 DIAGNOSIS — G8929 Other chronic pain: Secondary | ICD-10-CM | POA: Insufficient documentation

## 2016-10-13 DIAGNOSIS — J45909 Unspecified asthma, uncomplicated: Secondary | ICD-10-CM | POA: Insufficient documentation

## 2016-10-13 LAB — RAPID STREP SCREEN (MED CTR MEBANE ONLY): STREPTOCOCCUS, GROUP A SCREEN (DIRECT): NEGATIVE

## 2016-10-13 MED ORDER — PREDNISONE 20 MG PO TABS: 40.0000 mg | ORAL_TABLET | Freq: Every day | ORAL | 0 refills | Status: DC

## 2016-10-13 MED ORDER — BENZONATATE 100 MG PO CAPS: 200.0000 mg | ORAL_CAPSULE | Freq: Two times a day (BID) | ORAL | 0 refills | Status: DC | PRN

## 2016-10-13 MED ORDER — ALBUTEROL SULFATE HFA 108 (90 BASE) MCG/ACT IN AERS
2.0000 | INHALATION_SPRAY | Freq: Once | RESPIRATORY_TRACT | Status: AC
  Administered 2016-10-13: 2 via RESPIRATORY_TRACT
  Filled 2016-10-13: qty 6.7

## 2016-10-13 NOTE — ED Provider Notes (Signed)
[facility] Provider Note   CSN: [id number] Arrival date & time: 10/13/16  0930     History   Chief Complaint Chief Complaint  Patient presents with  . Cough  . Sore Throat    HPI Terry Bailey is a 26 y.o. male who presents emergency Department with chief complaint of URI. He states that he has had onset of sore throat, cough, pain with coughing, some wheezing, posttussive vomiting, body aches and fatigue over the past 2 days. He is also noticed some loose stools. His wife  also had similar infection last week. He isn't able to eat and drink but has decreased appetite. He has no contributing past medical history  HPI  Past Medical History:  Diagnosis Date  . Bronchitis   . Chronic back pain   . Upper respiratory infection, acute     Patient Active Problem List   Diagnosis Date Noted  . Right knee pain 09/04/2014  . URI 08/29/2008  . WHEEZING 02/26/2008  . LOW BACK PAIN, CHRONIC 05/24/2007    Past Surgical History:  Procedure Laterality Date  . EYE SURGERY         Home Medications    Prior to Admission medications   Medication Sig Start Date End Date Taking? Authorizing Provider  benzonatate (TESSALON) 100 MG capsule Take 2 capsules (200 mg total) by mouth 2 (two) times daily as needed for cough. 10/13/16   Rebbeca Sheperd, Cammy CopaAbigail, PA-C  predniSONE (DELTASONE) 20 MG tablet Take 2 tablets (40 mg total) by mouth daily. 10/13/16   Arthor CaptainHarris, Rita Vialpando, PA-C    Family History Family History  Problem Relation Age of Onset  . Asthma Mother   . Hypertension Mother   . Diabetes Other     Social History Social History  Substance Use Topics  . Smoking status: Current Every Day Smoker    Packs/day: 1.50    Types: Cigarettes  . Smokeless tobacco: Never Used  . Alcohol use No     Allergies   Patient has no known allergies.   Review of Systems Review of Systems Ten systems reviewed and are negative for acute change, except as noted in the HPI.   Physical  Exam Updated Vital Signs BP 115/75 (BP Location: Right Arm)   Pulse 86   Temp 98.2 F (36.8 C) (Oral)   Resp 20   Ht 6\' 1"  (1.854 m)   Wt 117.9 kg (260 lb)   SpO2 98%   BMI 34.30 kg/m   Physical Exam  Constitutional: He appears well-developed and well-nourished. No distress.  HENT:  Head: Normocephalic and atraumatic.  Erythema of the oropharynx without exudates  Eyes: Conjunctivae and EOM are normal. Pupils are equal, round, and reactive to light. No scleral icterus.  Neck: Normal range of motion. Neck supple.  Cardiovascular: Normal rate, regular rhythm and normal heart sounds.   Pulmonary/Chest: Effort normal. No respiratory distress. He has wheezes.  Mild wheezing  Abdominal: Soft. There is no tenderness.  Musculoskeletal: He exhibits no edema.  Neurological: He is alert.  Skin: Skin is warm and dry. He is not diaphoretic.  Psychiatric: His behavior is normal.  Nursing note and vitals reviewed.    ED Treatments / Results  Labs (all labs ordered are listed, but only abnormal results are displayed) Labs Reviewed  RAPID STREP SCREEN (NOT AT [facility])  CULTURE, GROUP A STREP [facility]    EKG  EKG Interpretation None       Radiology Dg Chest 2 View  Result  Date: 10/13/2016 CLINICAL DATA:  Sore throat, cough, shortness of Breath EXAM: CHEST  2 VIEW COMPARISON:  02/08/2013 FINDINGS: Heart and mediastinal contours are within normal limits. No focal opacities or effusions. No acute bony abnormality. IMPRESSION: No active cardiopulmonary disease. Electronically Signed   By: Charlett Nose M.D.   On: 10/13/2016 10:30    Procedures Procedures (including critical care time)  Medications Ordered in ED Medications  albuterol (PROVENTIL HFA;VENTOLIN HFA) 108 (90 Base) MCG/ACT inhaler 2 puff (2 puffs Inhalation Given 10/13/16 1122)     Initial Impression / Assessment and Plan / ED Course  I have reviewed the triage vital signs and the nursing notes.  Pertinent labs & imaging  results that were available during my care of the patient were reviewed by me and considered in my medical decision making (see chart for details).     Pt CXR negative for acute infiltrate. Patients symptoms are consistent with URI, likely viral etiology. Discussed that antibiotics are not indicated for viral infections. Pt will be discharged with symptomatic treatment.  Verbalizes understanding and is agreeable with plan. Pt is hemodynamically stable & in NAD prior to dc.   Final Clinical Impressions(s) / ED Diagnoses   Final diagnoses:  Upper respiratory tract infection, unspecified type  Mild reactive airways disease, unspecified whether persistent    New Prescriptions New Prescriptions   BENZONATATE (TESSALON) 100 MG CAPSULE    Take 2 capsules (200 mg total) by mouth 2 (two) times daily as needed for cough.   PREDNISONE (DELTASONE) 20 MG TABLET    Take 2 tablets (40 mg total) by mouth daily.     Arthor Captain, PA-C 10/13/16 1128    Little, Ambrose Finland, MD 10/14/16 [phone]

## 2016-10-13 NOTE — ED Triage Notes (Signed)
Cough, sore throat since yesterday. Pt spouse and son have had similar symptoms. Pt states he also vomited once and had one loose stool today.

## 2016-10-13 NOTE — Discharge Instructions (Signed)
You appear to have an upper respiratory infection (URI). An upper respiratory tract infection, or cold, is a viral infection of the air passages leading to the lungs. It is contagious and can be spread to others, especially during the first 3 or 4 days. It cannot be cured by antibiotics or other medicines. °RETURN IMMEDIATELY IF you develop shortness of breath, confusion or altered mental status, a new rash, become dizzy, faint, or poorly responsive, or are unable to be cared for at home. ° °

## 2016-10-15 LAB — CULTURE, GROUP A STREP (THRC)

## 2018-11-19 ENCOUNTER — Emergency Department (HOSPITAL_COMMUNITY)
Admission: EM | Admit: 2018-11-19 | Discharge: 2018-11-19 | Disposition: A | Discharge status: Home / Self Care | Payer: Self-pay | Attending: Emergency Medicine | Admitting: Emergency Medicine

## 2018-11-19 ENCOUNTER — Other Ambulatory Visit: Payer: Self-pay

## 2018-11-19 ENCOUNTER — Encounter (HOSPITAL_COMMUNITY): Payer: Self-pay | Admitting: Emergency Medicine

## 2018-11-19 DIAGNOSIS — K029 Dental caries, unspecified: Secondary | ICD-10-CM | POA: Insufficient documentation

## 2018-11-19 DIAGNOSIS — K0889 Other specified disorders of teeth and supporting structures: Secondary | ICD-10-CM | POA: Insufficient documentation

## 2018-11-19 DIAGNOSIS — F17210 Nicotine dependence, cigarettes, uncomplicated: Secondary | ICD-10-CM | POA: Insufficient documentation

## 2018-11-19 HISTORY — DX: Other intervertebral disc degeneration, lumbar region without mention of lumbar back pain or lower extremity pain: M51.369

## 2018-11-19 HISTORY — DX: Other intervertebral disc degeneration, lumbar region: M51.36

## 2018-11-19 MED ORDER — TRAMADOL HCL 50 MG PO TABS: 50.0000 mg | ORAL_TABLET | Freq: Four times a day (QID) | ORAL | 0 refills | Status: DC | PRN

## 2018-11-19 MED ORDER — PENICILLIN V POTASSIUM 500 MG PO TABS: 500.0000 mg | ORAL_TABLET | Freq: Four times a day (QID) | ORAL | 0 refills | Status: AC

## 2018-11-19 MED ORDER — PENICILLIN V POTASSIUM 500 MG PO TABS: 500.0000 mg | ORAL_TABLET | Freq: Four times a day (QID) | ORAL | 0 refills | Status: DC

## 2018-11-19 NOTE — ED Triage Notes (Signed)
Pt reports dental pain to L lower molar x 2 weeks. Pt reports trying multiple OTC meds with no relief.

## 2018-11-19 NOTE — ED Provider Notes (Signed)
[name] Provider Note   CSN: [id number] Arrival date & time: 11/19/18  1710     History   Chief Complaint Chief Complaint  Patient presents with  . Dental Pain    HPI Terry Bailey is a 28 y.o. male with history of degenerative disc disease who presents with a two-week history of left lower dental pain.  It started after his tooth broke.  He denies any fevers or lockjaw.  He does not currently have a dentist.  He has been taking Aleve, ibuprofen, Tylenol, and Goody powders without relief.  He denies any significant swelling.  He has pain sometimes radiating to his left ear     HPI  Past Medical History:  Diagnosis Date  . Bronchitis   . Chronic back pain   . Degenerative disc disease, lumbar   . Upper respiratory infection, acute     Patient Active Problem List   Diagnosis Date Noted  . Right knee pain 09/04/2014  . URI 08/29/2008  . WHEEZING 02/26/2008  . LOW BACK PAIN, CHRONIC 05/24/2007    Past Surgical History:  Procedure Laterality Date  . EYE SURGERY          Home Medications    Prior to Admission medications   Medication Sig Start Date End Date Taking? Authorizing Provider  benzonatate (TESSALON) 100 MG capsule Take 2 capsules (200 mg total) by mouth 2 (two) times daily as needed for cough. 10/13/16   Arthor CaptainHarris, Abigail, PA-C  penicillin v potassium (VEETID) 500 MG tablet Take 1 tablet (500 mg total) by mouth 4 (four) times daily for 7 days. 11/19/18 11/26/18  Emi HolesLaw, Oniya Mandarino M, PA-C  predniSONE (DELTASONE) 20 MG tablet Take 2 tablets (40 mg total) by mouth daily. 10/13/16   Arthor CaptainHarris, Abigail, PA-C  traMADol (ULTRAM) 50 MG tablet Take 1 tablet (50 mg total) by mouth every 6 (six) hours as needed for severe pain. 11/19/18   Emi HolesLaw, Mae Denunzio M, PA-C    Family History Family History  Problem Relation Age of Onset  . Asthma Mother   . Hypertension Mother   . Diabetes Other     Social History Social History   Tobacco Use   . Smoking status: Current Every Day Smoker    Packs/day: 1.50    Types: Cigarettes  . Smokeless tobacco: Never Used  Substance Use Topics  . Alcohol use: No    Alcohol/week: 0.0 standard drinks  . Drug use: No     Allergies   Patient has no known allergies.   Review of Systems Review of Systems  Constitutional: Negative for fever.  HENT: Positive for dental problem and ear pain. Negative for facial swelling.      Physical Exam Updated Vital Signs BP (!) 143/80 (BP Location: Right Arm)   Pulse 90   Temp 98.4 F (36.9 C) (Oral)   Resp 16   Ht 6\' 1"  (1.854 m)   Wt 132.9 kg   SpO2 98%   BMI 38.66 kg/m   Physical Exam Vitals signs and nursing note reviewed.  Constitutional:      General: He is not in acute distress.    Appearance: He is well-developed. He is not diaphoretic.  HENT:     Head: Normocephalic and atraumatic.     Right Ear: Tympanic membrane normal.     Left Ear: Tympanic membrane normal.     Mouth/Throat:     Dentition: Dental tenderness and dental caries present. No dental abscesses.  Pharynx: No oropharyngeal exudate.      Comments: No submandibular mass or tenderness Eyes:     General: No scleral icterus.       Right eye: No discharge.        Left eye: No discharge.     Conjunctiva/sclera: Conjunctivae normal.     Pupils: Pupils are equal, round, and reactive to light.  Neck:     Musculoskeletal: Normal range of motion and neck supple.     Thyroid: No thyromegaly.  Cardiovascular:     Rate and Rhythm: Normal rate and regular rhythm.     Heart sounds: Normal heart sounds. No murmur. No friction rub. No gallop.   Pulmonary:     Effort: Pulmonary effort is normal. No respiratory distress.     Breath sounds: Normal breath sounds. No stridor. No wheezing or rales.  Lymphadenopathy:     Cervical: No cervical adenopathy.  Skin:    General: Skin is warm and dry.     Coloration: Skin is not pale.     Findings: No rash.  Neurological:      Mental Status: He is alert.     Coordination: Coordination normal.      ED Treatments / Results  Labs (all labs ordered are listed, but only abnormal results are displayed) Labs Reviewed - No data to display  EKG None  Radiology No results found.  Procedures Procedures (including critical care time)  Medications Ordered in ED Medications - No data to display   Initial Impression / Assessment and Plan / ED Course  I have reviewed the triage vital signs and the nursing notes.  Pertinent labs & imaging results that were available during my care of the patient were reviewed by me and considered in my medical decision making (see chart for details).        Patient with dentalgia.  No abscess requiring immediate incision and drainage.  Exam not concerning for Ludwig's angina or pharyngeal abscess.  Will treat with penicillin, tramadol.  Patient vies not to combine with ibuprofen, but to alternate with Tylenol.  [place] narcotic database reviewed and found no discrepancies.  Pt instructed to follow-up with dentist, given several resources.  Discussed return precautions.  Patient understands and agrees with plan.  Patient vital stable throughout ED course and discharged in satisfactory condition.  Final Clinical Impressions(s) / ED Diagnoses   Final diagnoses:  Pain, dental    ED Discharge Orders         Ordered    penicillin v potassium (VEETID) 500 MG tablet  4 times daily     11/19/18 1747    traMADol (ULTRAM) 50 MG tablet  Every 6 hours PRN     11/19/18 1747           Frederica Kuster, PA-C 11/19/18 1750    Valarie Merino, MD 11/19/18 1825

## 2018-11-19 NOTE — Discharge Instructions (Addendum)
Take penicillin as prescribed until completed.  For severe pain, take tramadol every 6 hours.  Do not drive or operate machinery while taking this medication.  You can alternate with Tylenol, but do not combine tramadol and ibuprofen.  Please see a dentist as soon as possible.  If you call the dentist below within 24 hours and make an appointment, [facility] may be able to help you with your finances.  Make sure to bring your discharge paperwork with you to your appointment.  Please return the emergency department if you develop any new or worsening symptoms including lockjaw, significant increase in swelling, or fever.  Do not drink alcohol, drive, operate machinery or participate in any other potentially dangerous activities while taking opiate pain medication as it may make you sleepy. Do not take this medication with any other sedating medications, either prescription or over-the-counter. If you were prescribed Percocet or Vicodin, do not take these with acetaminophen (Tylenol) as it is already contained within these medications and overdose of Tylenol is dangerous.   This medication is an opiate (or narcotic) pain medication and can be habit forming.  Use it as little as possible to achieve adequate pain control.  Do not use or use it with extreme caution if you have a history of opiate abuse or dependence. This medication is intended for your use only - do not give any to anyone else and keep it in a secure place where nobody else, especially children, have access to it. It will also cause or worsen constipation, so you may want to consider taking an over-the-counter stool softener while you are taking this medication.

## 2019-01-23 ENCOUNTER — Encounter (HOSPITAL_COMMUNITY): Payer: Self-pay | Admitting: Emergency Medicine

## 2019-01-23 ENCOUNTER — Emergency Department (HOSPITAL_COMMUNITY)
Admission: EM | Admit: 2019-01-23 | Discharge: 2019-01-23 | Disposition: A | Discharge status: Home / Self Care | Payer: Medicaid Other | Attending: Emergency Medicine | Admitting: Emergency Medicine

## 2019-01-23 ENCOUNTER — Other Ambulatory Visit: Payer: Self-pay

## 2019-01-23 DIAGNOSIS — F17210 Nicotine dependence, cigarettes, uncomplicated: Secondary | ICD-10-CM | POA: Insufficient documentation

## 2019-01-23 DIAGNOSIS — B029 Zoster without complications: Secondary | ICD-10-CM | POA: Insufficient documentation

## 2019-01-23 MED ORDER — ACYCLOVIR 800 MG PO TABS: 800.0000 mg | ORAL_TABLET | Freq: Every day | ORAL | 0 refills | Status: AC

## 2019-01-23 MED ORDER — ACYCLOVIR 400 MG PO TABS: 400.0000 mg | ORAL_TABLET | Freq: Four times a day (QID) | ORAL | 0 refills | Status: DC

## 2019-01-23 NOTE — Discharge Instructions (Signed)
Please read and follow all provided instructions.  Your diagnoses today include:  1. Herpes zoster without complication     Tests performed today include:  Vital signs. See below for your results today.   Medications prescribed:   Acyclovir - medication to clear up shingles  Take any prescribed medications only as directed.  Home care instructions:  Follow any educational materials contained in this packet.  BE VERY CAREFUL not to take multiple medicines containing Tylenol (also called acetaminophen). Doing so can lead to an overdose which can damage your liver and cause liver failure and possibly death.   Follow-up instructions: Please follow-up with your primary care provider in the next 3 days for further evaluation of your symptoms.   Return instructions:   Please return to the Emergency Department if you experience worsening symptoms.   Return if you have worsening pain, fever, trouble with your vision.  Please return if you have any other emergent concerns.  Additional Information:  Your vital signs today were: BP (!) 151/75    Pulse 82    Temp 98.5 F (36.9 C)    Resp 20    SpO2 100%  If your blood pressure (BP) was elevated above 135/85 this visit, please have this repeated by your doctor within one month. --------------

## 2019-01-23 NOTE — ED Provider Notes (Signed)
[facility] EMERGENCY DEPARTMENT Provider Note   CSN: [id number] Arrival date & time: 01/23/19  1626     History   Chief Complaint Chief Complaint  Patient presents with  . Rash    HPI Terry Bailey is a 28 y.o. male.     Patient presents the emergency department with 5-day history of left-sided facial rash.  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  Patient did not have any real preceding symptoms.  He has had some clear drainage at the rash on the left side of his nose.  Rash extends to the left forehead.  It is mildly painful.  No itching.  No fevers.  Patient denies any recent illness.  He states that he has been under some stress.  He did have chickenpox as a child.  No sick contacts.     Past Medical History:  Diagnosis Date  . Bronchitis   . Chronic back pain   . Degenerative disc disease, lumbar   . Upper respiratory infection, acute     Patient Active Problem List   Diagnosis Date Noted  . Right knee pain 09/04/2014  . URI 08/29/2008  . WHEEZING 02/26/2008  . LOW BACK PAIN, CHRONIC 05/24/2007    Past Surgical History:  Procedure Laterality Date  . EYE SURGERY          Home Medications    Prior to Admission medications   Medication Sig Start Date End Date Taking? Authorizing Provider  acyclovir (ZOVIRAX) 400 MG tablet Take 1 tablet (400 mg total) by mouth 4 (four) times daily. 01/23/19   Carlisle Cater, PA-C    Family History Family History  Problem Relation Age of Onset  . Asthma Mother   . Hypertension Mother   . Diabetes Other     Social History Social History   Tobacco Use  . Smoking status: Current Every Day Smoker    Packs/day: 1.50    Types: Cigarettes  . Smokeless tobacco: Never Used  Substance Use Topics  . Alcohol use: No    Alcohol/week: 0.0 standard drinks  . Drug use: No     Allergies   Patient has no known allergies.   Review of Systems  Review of Systems  Constitutional: Negative for fever.  Eyes: Negative for photophobia, pain, discharge, redness, itching and visual disturbance.  Gastrointestinal: Negative for nausea and vomiting.  Skin: Positive for rash.     Physical Exam Updated Vital Signs BP (!) 151/75   Pulse 82   Temp 98.5 F (36.9 C)   Resp 20   SpO2 100%   Physical Exam Vitals signs and nursing note reviewed.  Constitutional:      Appearance: He is well-developed.  HENT:     Head: Normocephalic and atraumatic.     Comments: Patient with a vesicular rash on an erythematous base noted to the left side of the nose extending around the medial aspect of the orbit to the left forehead.  The more lateral portion of the rash appears to be forming vesicles.  No active drainage.  Consistent with shingles. Eyes:     General:        Right eye: No discharge.        Left eye: No discharge.     Conjunctiva/sclera: Conjunctivae normal.     Pupils: Pupils are equal, round, and reactive to light.     Comments: Corneas clear.  Normal sclera.  Neck:     Musculoskeletal: Normal range of motion and neck supple.  Pulmonary:     Effort: No respiratory distress.  Skin:    General: Skin is warm and dry.  Neurological:     Mental Status: He is alert.      ED Treatments / Results  Labs (all labs ordered are listed, but only abnormal results are displayed) Labs Reviewed - No data to display  EKG None  Radiology No results found.  Procedures Procedures (including critical care time)  Medications Ordered in ED Medications - No data to display   Initial Impression / Assessment and Plan / ED Course  I have reviewed the triage vital signs and the nursing notes.  Pertinent labs & imaging results that were available during my care of the patient were reviewed by me and considered in my medical decision making (see chart for details).        Patient seen and examined.  Rash appears to be consistent with  shingles.  No ocular involvement suspected.  Given greater than 72 hours of onset, will initiate antiviral therapy with acyclovir.  Encouraged follow-up with persistent symptoms or persistent pain.  Vital signs reviewed and are as follows: BP (!) 151/75   Pulse 82   Temp 98.5 F (36.9 C)   Resp 20   SpO2 100%     Final Clinical Impressions(s) / ED Diagnoses   Final diagnoses:  Herpes zoster without complication   Patient with facial rash consistent with shingles.  No signs of bacterial superinfection.  No systemic symptoms of illness.  Discussed expectant management with patient as well as treatment with anti-viral medications.  ED Discharge Orders         Ordered    acyclovir (ZOVIRAX) 400 MG tablet  4 times daily     01/23/19 1753           Renne CriglerGeiple, Keinan Brouillet, PA-C 01/23/19 1759    Loren RacerYelverton, David, MD 01/24/19 1245

## 2019-01-23 NOTE — ED Triage Notes (Signed)
Pt with sudden onset left side facial rash very painfult to touch, blisters look like shingles. Denies fevers.

## 2019-06-11 ENCOUNTER — Other Ambulatory Visit: Payer: Self-pay

## 2019-06-11 ENCOUNTER — Encounter (HOSPITAL_COMMUNITY): Payer: Self-pay

## 2019-06-11 ENCOUNTER — Emergency Department (HOSPITAL_COMMUNITY)
Admission: EM | Admit: 2019-06-11 | Discharge: 2019-06-12 | Disposition: A | Discharge status: Home / Self Care | Payer: Medicaid Other | Attending: Emergency Medicine | Admitting: Emergency Medicine

## 2019-06-11 DIAGNOSIS — K0889 Other specified disorders of teeth and supporting structures: Secondary | ICD-10-CM | POA: Insufficient documentation

## 2019-06-11 DIAGNOSIS — Z5321 Procedure and treatment not carried out due to patient leaving prior to being seen by health care provider: Secondary | ICD-10-CM | POA: Insufficient documentation

## 2019-06-11 NOTE — ED Triage Notes (Signed)
Pt reports L lower tooth pain that has been going on for a few weeks, worse today

## 2019-06-12 NOTE — ED Notes (Signed)
Pt stated unable to wait any longer and walked out.

## 2022-10-31 ENCOUNTER — Emergency Department (HOSPITAL_BASED_OUTPATIENT_CLINIC_OR_DEPARTMENT_OTHER)
Admission: EM | Admit: 2022-10-31 | Discharge: 2022-11-01 | Disposition: A | Discharge status: Home / Self Care | Payer: Medicaid Other | Attending: Emergency Medicine | Admitting: Emergency Medicine

## 2022-10-31 ENCOUNTER — Encounter (HOSPITAL_BASED_OUTPATIENT_CLINIC_OR_DEPARTMENT_OTHER): Payer: Self-pay | Admitting: Emergency Medicine

## 2022-10-31 ENCOUNTER — Other Ambulatory Visit: Payer: Self-pay

## 2022-10-31 DIAGNOSIS — F17210 Nicotine dependence, cigarettes, uncomplicated: Secondary | ICD-10-CM | POA: Insufficient documentation

## 2022-10-31 DIAGNOSIS — K029 Dental caries, unspecified: Secondary | ICD-10-CM

## 2022-10-31 MED ORDER — BUPIVACAINE-EPINEPHRINE (PF) 0.5% -1:200000 IJ SOLN
1.8000 mL | Freq: Once | INTRAMUSCULAR | Status: AC
  Administered 2022-10-31: 1.8 mL

## 2022-10-31 NOTE — ED Provider Notes (Incomplete)
[facility] Provider Note: Terry Dell, MD, FACEP  CSN: [id number] MRN: [id number] ARRIVAL: 10/31/22 at 2259 ROOM: MH03/[facility]03   CHIEF COMPLAINT  Dental Pain   HISTORY OF PRESENT ILLNESS  10/31/22 11:52 PM Terry Bailey is a 32 y.o. male with pain associated with a carious right lower first molar for the past 3 days.  He rates his pain as an 8 out of 10.  It is worse with swallowing.  He does not currently have a dentist.   Past Medical History:  Diagnosis Date  . Bronchitis   . Chronic back pain   . Degenerative disc disease, lumbar   . Upper respiratory infection, acute     Past Surgical History:  Procedure Laterality Date  . EYE SURGERY      Family History  Problem Relation Age of Onset  . Asthma Mother   . Hypertension Mother   . Diabetes Other     Social History   Tobacco Use  . Smoking status: Every Day    Packs/day: 1.5    Types: Cigarettes  . Smokeless tobacco: Never  Substance Use Topics  . Alcohol use: No    Alcohol/week: 0.0 standard drinks of alcohol  . Drug use: No    Prior to Admission medications   Not on File    Allergies Patient has no known allergies.   REVIEW OF SYSTEMS  Negative except as noted here or in the History of Present Illness.   PHYSICAL EXAMINATION  Initial Vital Signs Blood pressure (!) 174/97, pulse 74, temperature 97.8 F (36.6 C), resp. rate 20, height 6\' 1"  (1.854 m), weight 136.1 kg, SpO2 96 %.  Examination General: Well-developed, well-nourished male in no acute distress; appearance consistent with age of record HENT: normocephalic; atraumatic; right lower first and third molars carious to the gumline; no soft tissue tenderness of the right face or submandibular area Eyes: Normal appearance Neck: supple Heart: regular rate and rhythm Lungs: clear to auscultation bilaterally Abdomen: soft; nondistended; nontender; bowel sounds present Extremities: No deformity; full range of  motion Neurologic: Awake, alert and oriented; motor function intact in all extremities and symmetric; no facial droop Skin: Warm and dry Psychiatric: Normal mood and affect   RESULTS  Summary of this visit's results, reviewed and interpreted by myself:   EKG Interpretation  Date/Time:    Ventricular Rate:    PR Interval:    QRS Duration:   QT Interval:    QTC Calculation:   R Axis:     Text Interpretation:         Laboratory Studies: No results found for this or any previous visit (from the past 24 hour(s)). Imaging Studies: No results found.  ED COURSE and MDM  Nursing notes, initial and subsequent vitals signs, including pulse oximetry, reviewed and interpreted by myself.  Vitals:   10/31/22 2304 10/31/22 2308 10/31/22 2335  BP: (!) 174/97    Pulse: 74    Resp: 20    Temp: 97.8 F (36.6 C)    SpO2: 95%  96%  Weight:  136.1 kg   Height:  6\' 1"  (1.854 m)    Medications  amoxicillin (AMOXIL) capsule 500 mg (has no administration in time range)  bupivacaine-epinephrine (PF) (MARCAINE W/ EPI) 0.5% -1:200000 injection 1.8 mL (1.8 mLs Infiltration Given by Other 10/31/22 2359)   We will start the patient on amoxicillin and refer to the dentist on-call.  We will also provide a brief course of analgesics.   PROCEDURES  Procedures DENTAL BLOCK 1.8 milliliters of 0.5% bupivacaine with epinephrine were injected into the buccal fold adjacent to the right lower first molar. The patient tolerated this well and there were no immediate complications. Adequate analgesia was obtained.   ED DIAGNOSES  No diagnosis found.

## 2022-10-31 NOTE — ED Triage Notes (Signed)
Patient arrived via POV c/o dental pain to lower right jaw. X 3 days. Patient is AO x 4, VS WDL, normal gait.

## 2022-10-31 NOTE — ED Provider Notes (Signed)
[facility] Provider Note: [name], MD, FACEP  CSN: [id number] MRN: [id number] ARRIVAL: 10/31/22 at 2259 ROOM: [facility]03/[facility]03   CHIEF COMPLAINT  Dental Pain   HISTORY OF PRESENT ILLNESS  10/31/22 11:52 PM Terry Bailey is a 32 y.o. male with pain associated with a carious right lower first molar for the past 3 days.  He rates his pain as an 8 out of 10.  It is worse with swallowing.  He does not currently have a Education officer, community.   Past Medical History:  Diagnosis Date   Bronchitis    Chronic back pain    Degenerative disc disease, lumbar    Upper respiratory infection, acute     Past Surgical History:  Procedure Laterality Date   EYE SURGERY      Family History  Problem Relation Age of Onset   Asthma Mother    Hypertension Mother    Diabetes Other     Social History   Tobacco Use   Smoking status: Every Day    Packs/day: 1.5    Types: Cigarettes   Smokeless tobacco: Never  Substance Use Topics   Alcohol use: No    Alcohol/week: 0.0 standard drinks of alcohol   Drug use: No    Prior to Admission medications   Medication Sig Start Date End Date Taking? Authorizing Provider  amoxicillin (AMOXIL) 500 MG capsule Take 1 capsule (500 mg total) by mouth 3 (three) times daily. 11/01/22  Yes Gracin Soohoo, MD  HYDROcodone-acetaminophen (NORCO) 5-325 MG tablet Take 1 tablet by mouth every 4 (four) hours as needed for severe pain. 11/01/22  Yes Mazella Deen, MD  naproxen (NAPROSYN) 500 MG tablet Take 1 tablet twice daily as needed for dental pain. 11/01/22  Yes Khamiya Varin, MD    Allergies Patient has no known allergies.   REVIEW OF SYSTEMS  Negative except as noted here or in the History of Present Illness.   PHYSICAL EXAMINATION  Initial Vital Signs Blood pressure (!) 174/97, pulse 74, temperature 97.8 F (36.6 C), resp. rate 20, height 6\' 1"  (1.854 m), weight 136.1 kg, SpO2 96 %.  Examination General: Well-developed, well-nourished male in no acute  distress; appearance consistent with age of record HENT: normocephalic; atraumatic; right lower first and third molars carious to the gumline; no soft tissue tenderness of the right face or submandibular area Eyes: Normal appearance Neck: supple Heart: regular rate and rhythm Lungs: clear to auscultation bilaterally Abdomen: soft; nondistended; nontender; bowel sounds present Extremities: No deformity; full range of motion Neurologic: Awake, alert and oriented; motor function intact in all extremities and symmetric; no facial droop Skin: Warm and dry Psychiatric: Normal mood and affect   RESULTS  Summary of this visit's results, reviewed and interpreted by myself:   EKG Interpretation  Date/Time:    Ventricular Rate:    PR Interval:    QRS Duration:   QT Interval:    QTC Calculation:   R Axis:     Text Interpretation:         Laboratory Studies: No results found for this or any previous visit (from the past 24 hour(s)). Imaging Studies: No results found.  ED COURSE and MDM  Nursing notes, initial and subsequent vitals signs, including pulse oximetry, reviewed and interpreted by myself.  Vitals:   10/31/22 2304 10/31/22 2308 10/31/22 2335  BP: (!) 174/97    Pulse: 74    Resp: 20    Temp: 97.8 F (36.6 C)    SpO2: 95%  96%  Weight:  136.1 kg   Height:  6\' 1"  (1.854 m)    Medications  amoxicillin (AMOXIL) capsule 500 mg (has no administration in time range)  bupivacaine-epinephrine (PF) (MARCAINE W/ EPI) 0.5% -1:200000 injection 1.8 mL (1.8 mLs Infiltration Given by Other 10/31/22 2359)   We will start the patient on amoxicillin and refer to the dentist on-call.  We will also provide a brief course of analgesics.   PROCEDURES  Procedures DENTAL BLOCK 1.8 milliliters of 0.5% bupivacaine with epinephrine were injected into the buccal fold adjacent to the right lower first molar. The patient tolerated this well and there were no immediate complications. Adequate  analgesia was obtained.   ED DIAGNOSES     ICD-10-CM   1. Pain due to dental caries  K02.9          Seleni Meller, MD 11/01/22 [phone]

## 2022-11-01 MED ORDER — AMOXICILLIN 500 MG PO CAPS: 500.0000 mg | ORAL_CAPSULE | Freq: Three times a day (TID) | ORAL | 0 refills | Status: DC

## 2022-11-01 MED ORDER — HYDROCODONE-ACETAMINOPHEN 5-325 MG PO TABS: 1.0000 | ORAL_TABLET | ORAL | 0 refills | Status: DC | PRN

## 2022-11-01 MED ORDER — NAPROXEN 500 MG PO TABS: ORAL_TABLET | ORAL | 0 refills | Status: DC

## 2022-11-01 MED ORDER — AMOXICILLIN 500 MG PO CAPS
500.0000 mg | ORAL_CAPSULE | Freq: Once | ORAL | Status: AC
  Administered 2022-11-01: 500 mg via ORAL
  Filled 2022-11-01: qty 1

## 2023-07-20 ENCOUNTER — Emergency Department (HOSPITAL_BASED_OUTPATIENT_CLINIC_OR_DEPARTMENT_OTHER)
Admission: EM | Admit: 2023-07-20 | Discharge: 2023-07-20 | Disposition: A | Discharge status: Home / Self Care | Attending: Emergency Medicine | Admitting: Emergency Medicine

## 2023-07-20 ENCOUNTER — Emergency Department (HOSPITAL_BASED_OUTPATIENT_CLINIC_OR_DEPARTMENT_OTHER)

## 2023-07-20 ENCOUNTER — Encounter (HOSPITAL_BASED_OUTPATIENT_CLINIC_OR_DEPARTMENT_OTHER): Payer: Self-pay | Admitting: Emergency Medicine

## 2023-07-20 ENCOUNTER — Other Ambulatory Visit: Payer: Self-pay

## 2023-07-20 DIAGNOSIS — R1011 Right upper quadrant pain: Secondary | ICD-10-CM | POA: Diagnosis present

## 2023-07-20 DIAGNOSIS — D72829 Elevated white blood cell count, unspecified: Secondary | ICD-10-CM | POA: Diagnosis not present

## 2023-07-20 DIAGNOSIS — R7401 Elevation of levels of liver transaminase levels: Secondary | ICD-10-CM | POA: Diagnosis not present

## 2023-07-20 DIAGNOSIS — R059 Cough, unspecified: Secondary | ICD-10-CM | POA: Diagnosis not present

## 2023-07-20 LAB — URINALYSIS, MICROSCOPIC (REFLEX)

## 2023-07-20 LAB — COMPREHENSIVE METABOLIC PANEL
ALT: 53 U/L — ABNORMAL HIGH (ref 0–44)
AST: 37 U/L (ref 15–41)
Albumin: 3.8 g/dL (ref 3.5–5.0)
Alkaline Phosphatase: 43 U/L (ref 38–126)
Anion gap: 5 (ref 5–15)
BUN: 12 mg/dL (ref 6–20)
CO2: 23 mmol/L (ref 22–32)
Calcium: 9 mg/dL (ref 8.9–10.3)
Chloride: 107 mmol/L (ref 98–111)
Creatinine, Ser: 0.86 mg/dL (ref 0.61–1.24)
GFR, Estimated: >60 mL/min (ref >60)
Glucose, Bld: 92 mg/dL (ref 70–99)
Potassium: 4.1 mmol/L (ref 3.5–5.1)
Sodium: 135 mmol/L (ref 135–145)
Total Bilirubin: 0.8 mg/dL (ref 0.0–1.2)
Total Protein: 7.4 g/dL (ref 6.5–8.1)

## 2023-07-20 LAB — URINALYSIS, ROUTINE W REFLEX MICROSCOPIC
Bilirubin Urine: NEGATIVE
Glucose, UA: NEGATIVE mg/dL
Ketones, ur: NEGATIVE mg/dL
Leukocytes,Ua: NEGATIVE
Nitrite: NEGATIVE
Protein, ur: NEGATIVE mg/dL
Specific Gravity, Urine: >=1.03 (ref 1.005–1.030)
pH: 6 (ref 5.0–8.0)

## 2023-07-20 LAB — TROPONIN I (HIGH SENSITIVITY)
Troponin I (High Sensitivity): <2 ng/L (ref <18)
Troponin I (High Sensitivity): <2 ng/L (ref <18)

## 2023-07-20 LAB — CBC
HCT: 42.5 % (ref 39.0–52.0)
Hemoglobin: 14.4 g/dL (ref 13.0–17.0)
MCH: 29.3 pg (ref 26.0–34.0)
MCHC: 33.9 g/dL (ref 30.0–36.0)
MCV: 86.4 fL (ref 80.0–100.0)
Platelets: 191 10*3/uL (ref 150–400)
RBC: 4.92 MIL/uL (ref 4.22–5.81)
RDW: 13.4 % (ref 11.5–15.5)
WBC: 11.2 10*3/uL — ABNORMAL HIGH (ref 4.0–10.5)
nRBC: 0 % (ref 0.0–0.2)

## 2023-07-20 LAB — D-DIMER, QUANTITATIVE: D-Dimer, Quant: 0.39 ug{FEU}/mL (ref 0.00–0.50)

## 2023-07-20 LAB — LIPASE, BLOOD: Lipase: 25 U/L (ref 11–51)

## 2023-07-20 MED ORDER — ONDANSETRON HCL 4 MG/2ML IJ SOLN
4.0000 mg | Freq: Once | INTRAMUSCULAR | Status: DC
  Filled 2023-07-20: qty 2

## 2023-07-20 MED ORDER — HYDROMORPHONE HCL 1 MG/ML IJ SOLN
0.5000 mg | Freq: Once | INTRAMUSCULAR | Status: DC
  Filled 2023-07-20: qty 1

## 2023-07-20 NOTE — ED Notes (Addendum)
 Patient advised we need a urine specimen Patient stated they are unable to urinate at this time

## 2023-07-20 NOTE — ED Notes (Signed)
 Pt advised he took ibuprofen without improvement last night. Also advised he will hold off on meds for now. No nausea and "tries not to take anything" when offered by EDP.

## 2023-07-20 NOTE — ED Notes (Signed)

## 2023-07-20 NOTE — Discharge Instructions (Signed)
 You were seen in the emergency department for your abdominal pain.  Your workup showed no signs of gallstones or gallbladder disease, no signs of infection or blood clots.  It is unclear what is causing your pain but might be due to strained or pulled muscle.  You can take Tylenol and Motrin every 6 hours as needed for pain and can follow-up with your primary doctor in the next few days to have your symptoms rechecked.  You should return to the emergency department if you are having fevers, you develop repetitive vomiting, you have significantly worsening pain or any other new or concerning symptoms.

## 2023-07-20 NOTE — ED Triage Notes (Addendum)
 Rt sided abd pain x 3 days hurts to take a deep breath denies n/v/d  no dysuria  no rash no fever

## 2023-07-20 NOTE — ED Notes (Signed)
 Pt was offered pain medicine again, but refused kindly. He will call out if he changes his mind.

## 2023-07-20 NOTE — ED Provider Notes (Signed)
 [place] [facility] Provider Note   CSN: [id number] Arrival date & time: 07/20/23  0935     History  Chief Complaint  Patient presents with   Abdominal Pain    Terry Bailey is a 33 y.o. male.  Patient is a 33 year old male with no significant past medical history presenting to the emergency department with right upper quadrant pain.  The patient states that he has had pain in his right upper abdomen for the last 3 to 4 days.  He states that the pain is constant and does not seem to be affected by eating.  He states that he feels like he cannot take a deep breath and the pain is worse with deep breaths.  He states that he has had a nonproductive cough.  He denies any fever.  He denies any nausea, vomiting, diarrhea or constipation.  He denies any lower extremity swelling.  He denies any history of VTE, any recent hospitalization or surgery, any recent long travel in the car or plane, hormone use or cancer history.  The history is provided by the patient.  Abdominal Pain      Home Medications Prior to Admission medications   Medication Sig Start Date End Date Taking? Authorizing Provider  amoxicillin (AMOXIL) 500 MG capsule Take 1 capsule (500 mg total) by mouth 3 (three) times daily. 11/01/22   Molpus, John, MD  HYDROcodone-acetaminophen (NORCO) 5-325 MG tablet Take 1 tablet by mouth every 4 (four) hours as needed for severe pain. 11/01/22   Molpus, John, MD  naproxen (NAPROSYN) 500 MG tablet Take 1 tablet twice daily as needed for dental pain. 11/01/22   Molpus, Jonny Ruiz, MD      Allergies    Patient has no known allergies.    Review of Systems   Review of Systems  Gastrointestinal:  Positive for abdominal pain.    Physical Exam Updated Vital Signs BP 129/65   Pulse 61   Temp 98.3 F (36.8 C)   Resp (!) 22   Ht 6\' 1"  (1.854 m)   Wt 136.1 kg   SpO2 96%   BMI 39.58 kg/m  Physical Exam Vitals and nursing note reviewed.   Constitutional:      General: He is not in acute distress.    Appearance: He is well-developed.  HENT:     Head: Normocephalic and atraumatic.     Mouth/Throat:     Mouth: Mucous membranes are moist.  Eyes:     Extraocular Movements: Extraocular movements intact.  Cardiovascular:     Rate and Rhythm: Normal rate and regular rhythm.     Heart sounds: Normal heart sounds.  Pulmonary:     Effort: Pulmonary effort is normal.     Breath sounds: Normal breath sounds.  Abdominal:     General: Abdomen is flat.     Palpations: Abdomen is soft.     Tenderness: There is abdominal tenderness in the right upper quadrant. There is no guarding or rebound.  Skin:    General: Skin is warm and dry.  Neurological:     General: No focal deficit present.     Mental Status: He is alert and oriented to person, place, and time.  Psychiatric:        Mood and Affect: Mood normal.        Behavior: Behavior normal.     ED Results / Procedures / Treatments   Labs (all labs ordered are listed, but only abnormal results  are displayed) Labs Reviewed  COMPREHENSIVE METABOLIC PANEL - Abnormal; Notable for the following components:      Result Value   ALT 53 (*)    All other components within normal limits  CBC - Abnormal; Notable for the following components:   WBC 11.2 (*)    All other components within normal limits  URINALYSIS, ROUTINE W REFLEX MICROSCOPIC - Abnormal; Notable for the following components:   Hgb urine dipstick TRACE (*)    All other components within normal limits  URINALYSIS, MICROSCOPIC (REFLEX) - Abnormal; Notable for the following components:   Bacteria, UA RARE (*)    All other components within normal limits  LIPASE, BLOOD  D-DIMER, QUANTITATIVE  TROPONIN I (HIGH SENSITIVITY)  TROPONIN I (HIGH SENSITIVITY)    EKG EKG Interpretation Date/Time:  Thursday July 20 2023 09:48:03 EDT Ventricular Rate:  86 PR Interval:  130 QRS Duration:  78 QT Interval:  348 QTC  Calculation: 417 R Axis:   77  Text Interpretation: Sinus rhythm No previous ECGs available Confirmed by Elayne Snare ([id number]) on 07/20/2023 10:52:53 AM  Radiology US Abdomen Limited RUQ (LIVER/GB) Result Date: 07/20/2023 CLINICAL DATA:  Right upper quadrant pain EXAM: ULTRASOUND ABDOMEN LIMITED RIGHT UPPER QUADRANT COMPARISON:  CT abdomen pelvis 08/13/2013 FINDINGS: Gallbladder: No gallstones or wall thickening visualized. No sonographic Murphy sign noted by sonographer. Common bile duct: Diameter: 3.1 mm Liver: Increased echogenicity. No focal lesion. Portal vein is patent on color Doppler imaging with normal direction of blood flow towards the liver. Other: None. IMPRESSION: 1. No cholelithiasis or sonographic evidence for acute cholecystitis. 2. Increased hepatic parenchymal echogenicity suggestive of steatosis. Electronically Signed   By: Annia Belt M.D.   On: 07/20/2023 11:44   DG Chest 2 View Result Date: 07/20/2023 CLINICAL DATA:  Right lower quadrant chest pain EXAM: CHEST - 2 VIEW COMPARISON:  Chest radiograph 10/13/2016 FINDINGS: The heart size and mediastinal contours are within normal limits. Both lungs are clear. The visualized skeletal structures are unremarkable. IMPRESSION: No active cardiopulmonary disease. Electronically Signed   By: Annia Belt M.D.   On: 07/20/2023 11:42    Procedures Procedures    Medications Ordered in ED Medications  HYDROmorphone (DILAUDID) injection 0.5 mg (0.5 mg Intravenous Patient Refused/Not Given 07/20/23 1144)  ondansetron (ZOFRAN) injection 4 mg (4 mg Intravenous Patient Refused/Not Given 07/20/23 1144)    ED Course/ Medical Decision Making/ A&P Clinical Course as of 07/20/23 1424  Thu Jul 20, 2023  1120 Mildly elevated ALT and leukocytosis, otherwise labs within normal range. [VK]  1150 No acute abnormality on imaging to explain symptoms. [VK]  1317 D-dimer negative making PE unlikely. UA pending. [VK]  1407 UA with trace hemoglobin,  rare bacteria, no signs or symptoms of UTI or pyelo. With no RBC seen and no flank pain have low suspicion for stone. Suspect msk pain and he is stable for discharge home with outpatient follow up. [VK]    Clinical Course User Index [VK] Rexford Maus, DO                                 Medical Decision Making This patient presents to the ED with chief complaint(s) of RUQ pain with no pertinent past medical history which further complicates the presenting complaint. The complaint involves an extensive differential diagnosis and also carries with it a high risk of complications and morbidity.    The differential diagnosis includes cholelithiasis,  cholecystitis, hepatitis, pancreatitis, gastritis, GERD, pneumonia, pneumothorax, pulmonary edema, pleural effusion, considering PE though he is PERC negative making this less likely, atypical ACS, musculoskeletal pain  Additional history obtained: Additional history obtained from N/A Records reviewed N/A  ED Course and Reassessment: On patient's arrival he is hemodynamically stable in no acute distress.  EKG on arrival showed normal sinus rhythm without acute ischemic changes.  Patient will have labs including LFTs and lipase and will have troponin.  Will have chest x-ray and right upper quadrant ultrasound.  Was given pain and nausea control and will be closely reassessed.   Independent labs interpretation:  The following labs were independently interpreted: within normal range  Independent visualization of imaging: - I independently visualized the following imaging with scope of interpretation limited to determining acute life threatening conditions related to emergency care: CXR, RUQ [place], which revealed no acute disease  Consultation: - Consulted or discussed management/test interpretation w/ external professional: N/A  Consideration for admission or further workup: Patient has no emergent conditions requiring admission or further  work-up at this time and is stable for discharge home with primary care follow-up  Social Determinants of health: N/A    Amount and/or Complexity of Data Reviewed Labs: ordered. Radiology: ordered.  Risk Prescription drug management.          Final Clinical Impression(s) / ED Diagnoses Final diagnoses:  RUQ pain    Rx / DC Orders ED Discharge Orders     None         Rexford Maus, DO 07/20/23 1424

## 2023-09-22 ENCOUNTER — Other Ambulatory Visit: Payer: Self-pay

## 2023-09-22 ENCOUNTER — Encounter (HOSPITAL_BASED_OUTPATIENT_CLINIC_OR_DEPARTMENT_OTHER): Payer: Self-pay | Admitting: Emergency Medicine

## 2023-09-22 ENCOUNTER — Emergency Department (HOSPITAL_BASED_OUTPATIENT_CLINIC_OR_DEPARTMENT_OTHER): Admission: EM | Admit: 2023-09-22 | Discharge: 2023-09-22 | Disposition: A | Discharge status: Home / Self Care

## 2023-09-22 DIAGNOSIS — K047 Periapical abscess without sinus: Secondary | ICD-10-CM | POA: Diagnosis not present

## 2023-09-22 DIAGNOSIS — K0889 Other specified disorders of teeth and supporting structures: Secondary | ICD-10-CM | POA: Diagnosis present

## 2023-09-22 MED ORDER — KETOROLAC TROMETHAMINE 10 MG PO TABS: 10.0000 mg | ORAL_TABLET | Freq: Four times a day (QID) | ORAL | 0 refills | Status: DC | PRN

## 2023-09-22 MED ORDER — KETOROLAC TROMETHAMINE 15 MG/ML IJ SOLN
15.0000 mg | Freq: Once | INTRAMUSCULAR | Status: AC
  Administered 2023-09-22: 15 mg via INTRAMUSCULAR
  Filled 2023-09-22: qty 1

## 2023-09-22 MED ORDER — AMOXICILLIN-POT CLAVULANATE 875-125 MG PO TABS: 1.0000 | ORAL_TABLET | Freq: Two times a day (BID) | ORAL | 0 refills | Status: DC

## 2023-09-22 MED ORDER — AMOXICILLIN-POT CLAVULANATE 875-125 MG PO TABS
1.0000 | ORAL_TABLET | Freq: Once | ORAL | Status: AC
  Administered 2023-09-22: 1 via ORAL
  Filled 2023-09-22: qty 1

## 2023-09-22 NOTE — ED Triage Notes (Signed)
 Left lower dental pain since 3 days ago.  Pt has appt Monday with dentist.  No known fever.  No known swelling.  Has not been on antibiotics as yet.

## 2023-09-22 NOTE — ED Provider Notes (Signed)
 [place] [facility] Provider Note   CSN: [id number] Arrival date & time: 09/22/23  1802     History  Chief Complaint  Patient presents with   Dental Pain    Terry Bailey is a 33 y.o. male.  33 year old male presenting to the emergency department today with dental pain.  The patient states that this began 3 days ago.  The patient states he has been having ongoing issues with this over the past few months.  He denies any difficulty breathing or swallowing.  States that he did see periodontist who was planning on pulling multiple teeth but was lost to follow-up.  He states that he is trying to get in for follow-up now.  He does have an appoint with his primary care provider on Monday.  He has been taking Tylenol  and ibuprofen  at home for pain which is not really been helping.  He came to the ER today for further evaluation regarding this.  He denies any difficulty breathing or swallowing.   Dental Pain      Home Medications Prior to Admission medications   Medication Sig Start Date End Date Taking? Authorizing Provider  amoxicillin -clavulanate (AUGMENTIN) 875-125 MG tablet Take 1 tablet by mouth every 12 (twelve) hours. 09/22/23  Yes Carin Charleston, MD  ketorolac  (TORADOL ) 10 MG tablet Take 1 tablet (10 mg total) by mouth every 6 (six) hours as needed. 09/22/23  Yes Carin Charleston, MD  HYDROcodone -acetaminophen  (NORCO) 5-325 MG tablet Take 1 tablet by mouth every 4 (four) hours as needed for severe pain. 11/01/22   Molpus, Autry Legions, MD      Allergies    Patient has no known allergies.    Review of Systems   Review of Systems  HENT:  Positive for dental problem.   All other systems reviewed and are negative.   Physical Exam Updated Vital Signs BP 138/86   Pulse 71   Temp 98.1 F (36.7 C) (Oral)   Resp 16   Ht 6\' 1"  (1.854 m)   Wt 136.1 kg   SpO2 96%   BMI 39.58 kg/m  Physical Exam Vitals and nursing note reviewed.  Constitutional:       Appearance: Normal appearance.  HENT:     Mouth/Throat:     Comments: The patient has poor dentition, there is some mild erythema noted around tooth 19 with no palpable abscess noted.  There is no significant swelling of the floor of the mouth or posterior oropharynx.  No trismus. Neurological:     Mental Status: He is alert.     ED Results / Procedures / Treatments   Labs (all labs ordered are listed, but only abnormal results are displayed) Labs Reviewed - No data to display  EKG None  Radiology No results found.  Procedures Procedures    Medications Ordered in ED Medications  ketorolac  (TORADOL ) 15 MG/ML injection 15 mg (has no administration in time range)  amoxicillin -clavulanate (AUGMENTIN) 875-125 MG per tablet 1 tablet (has no administration in time range)    ED Course/ Medical Decision Making/ A&P                                 Medical Decision Making 33 year old male presenting to emergency department today with dental pain.  I will give patient Toradol  here for pain.  Will give him a short prescription for this.  Also started on Augmentin.  He will be discharged with dental follow-up.  He does not have any findings on exam consistent with deep space infection at this time.           Final Clinical Impression(s) / ED Diagnoses Final diagnoses:  Dental infection    Rx / DC Orders ED Discharge Orders          Ordered    amoxicillin -clavulanate (AUGMENTIN) 875-125 MG tablet  Every 12 hours        09/22/23 1906    ketorolac  (TORADOL ) 10 MG tablet  Every 6 hours PRN        09/22/23 1906              Carin Charleston, MD 09/22/23 1907

## 2023-09-22 NOTE — Discharge Instructions (Signed)
 Please take the biotic as prescribed.  Take the Toradol  as needed for pain.  Please follow-up with the dentist.  Call the number on Monday to see if they take your insurance.  Return to the ER for worsening symptoms.

## 2023-09-22 NOTE — ED Notes (Signed)
 D/c paperwork reviewed with pt, including prescriptions and follow up care.  All questions and/or concerns addressed at time of d/c.  No further needs expressed. . Pt verbalized understanding, Ambulatory without assistance to ED exit, NAD.

## 2024-04-27 ENCOUNTER — Encounter (HOSPITAL_BASED_OUTPATIENT_CLINIC_OR_DEPARTMENT_OTHER): Payer: Self-pay | Admitting: Emergency Medicine

## 2024-04-27 ENCOUNTER — Emergency Department (HOSPITAL_BASED_OUTPATIENT_CLINIC_OR_DEPARTMENT_OTHER)
Admission: EM | Admit: 2024-04-27 | Discharge: 2024-04-27 | Disposition: A | Discharge status: Home / Self Care | Attending: Emergency Medicine | Admitting: Emergency Medicine

## 2024-04-27 ENCOUNTER — Other Ambulatory Visit: Payer: Self-pay

## 2024-04-27 DIAGNOSIS — S39012A Strain of muscle, fascia and tendon of lower back, initial encounter: Secondary | ICD-10-CM | POA: Insufficient documentation

## 2024-04-27 DIAGNOSIS — M545 Low back pain, unspecified: Secondary | ICD-10-CM | POA: Diagnosis present

## 2024-04-27 DIAGNOSIS — S76311A Strain of muscle, fascia and tendon of the posterior muscle group at thigh level, right thigh, initial encounter: Secondary | ICD-10-CM | POA: Diagnosis not present

## 2024-04-27 DIAGNOSIS — M76891 Other specified enthesopathies of right lower limb, excluding foot: Secondary | ICD-10-CM | POA: Diagnosis not present

## 2024-04-27 DIAGNOSIS — X58XXXA Exposure to other specified factors, initial encounter: Secondary | ICD-10-CM | POA: Insufficient documentation

## 2024-04-27 DIAGNOSIS — T148XXA Other injury of unspecified body region, initial encounter: Secondary | ICD-10-CM

## 2024-04-27 MED ORDER — ACETAMINOPHEN 500 MG PO TABS
1000.0000 mg | ORAL_TABLET | Freq: Once | ORAL | Status: AC
  Administered 2024-04-27: 1000 mg via ORAL
  Filled 2024-04-27: qty 2

## 2024-04-27 MED ORDER — OXYCODONE HCL 5 MG PO TABS: 5.0000 mg | ORAL_TABLET | ORAL | 0 refills | Status: AC | PRN

## 2024-04-27 MED ORDER — IBUPROFEN 600 MG PO TABS: 600.0000 mg | ORAL_TABLET | Freq: Four times a day (QID) | ORAL | 0 refills | Status: AC | PRN

## 2024-04-27 MED ORDER — CYCLOBENZAPRINE HCL 10 MG PO TABS: 10.0000 mg | ORAL_TABLET | Freq: Two times a day (BID) | ORAL | 0 refills | Status: AC | PRN

## 2024-04-27 MED ORDER — KETOROLAC TROMETHAMINE 60 MG/2ML IM SOLN
30.0000 mg | Freq: Once | INTRAMUSCULAR | Status: AC
  Administered 2024-04-27: 30 mg via INTRAMUSCULAR
  Filled 2024-04-27: qty 2

## 2024-04-27 MED ORDER — ACETAMINOPHEN 325 MG PO TABS: 650.0000 mg | ORAL_TABLET | Freq: Four times a day (QID) | ORAL | 0 refills | Status: AC | PRN

## 2024-04-27 MED ORDER — LIDOCAINE 5 % EX PTCH
1.0000 | MEDICATED_PATCH | Freq: Once | CUTANEOUS | Status: DC
  Administered 2024-04-27: 1 via TRANSDERMAL
  Filled 2024-04-27: qty 1

## 2024-04-27 NOTE — Discharge Instructions (Addendum)
 Please follow-up with your Primary Care Physician within the next week. Please take your medications as instructed and discuss any changes to your medications with your primary care physician.   Also please follow up with orthopedics in the office   Please return to the Emergency Department if you have any leg numbness, leg weakness, difficulty walking, fevers, worsening of pain, lightheadedness, lose consciousness, severe abdominal pain, severe headache, difficulty urinating, or difficulty having a bowel movement.    Please return to the emergency department immediately for any new or concerning symptoms, or if you get worse.

## 2024-04-27 NOTE — ED Triage Notes (Signed)
 Pt c/o sciatica pain to posterior RLE x 1 wk; gabapentin and TENS unit not helping

## 2024-04-27 NOTE — ED Provider Notes (Signed)
 " [place] [facility] Provider Note  CSN: [id number] Arrival date & time: 04/27/24 1330  Chief Complaint(s) Back Pain  HPI Terry Bailey is a 33 y.o. male with past medical history as below, significant for chronic back pain, DDD lumbar, knee pain who presents to the ED with complaint of low back pain  Patient reports intermittent back pain over the past 10 or so years, feels he is having a flareup of his sciatica.  He has tried TENS unit, over-the-counter pain medications without much relief.  Symptoms have been ongoing this time for around a week.  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  Denies any traumatic injury, no recent fall.  No fevers or chills, no recent spinal injection or manipulation. Pain worse w/ extension of the RLE and with walking.  He follows with [facility]   Past Medical History Past Medical History:  Diagnosis Date   Bronchitis    Chronic back pain    Degenerative disc disease, lumbar    Upper respiratory infection, acute    Patient Active Problem List   Diagnosis Date Noted   Right knee pain 09/04/2014   URI 08/29/2008   WHEEZING 02/26/2008   LOW BACK PAIN, CHRONIC 05/24/2007   Home Medication(s) Prior to Admission medications  Medication Sig Start Date End Date Taking? Authorizing Provider  amoxicillin -clavulanate (AUGMENTIN ) 875-125 MG tablet Take 1 tablet by mouth every 12 (twelve) hours. 09/22/23   Ula Prentice SAUNDERS, MD  HYDROcodone -acetaminophen  (NORCO) 5-325 MG tablet Take 1 tablet by mouth every 4 (four) hours as needed for severe pain. 11/01/22   Molpus, John, MD  ketorolac  (TORADOL ) 10 MG tablet Take 1 tablet (10 mg total) by mouth every 6 (six) hours as needed. 09/22/23   Ula Prentice SAUNDERS, MD                                                                                                                                    Past Surgical History Past Surgical History:  Procedure Laterality Date    EYE SURGERY     Family History Family History  Problem Relation Age of Onset   Asthma Mother    Hypertension Mother    Diabetes Other     Social History Social History[1] Allergies Patient has no known allergies.  Review of Systems A thorough review of systems was obtained and all systems are negative except as noted in the HPI and PMH.   Physical Exam Vital Signs  I have reviewed the triage vital signs BP (!) 149/84 (BP Location: Right Wrist)   Pulse 92   Temp 98.2 F (36.8 C) (Oral)   Resp (!) 24   Ht 6' 2 (1.88 m)   Wt 136.1 kg   SpO2 100%   BMI 38.52 kg/m  Physical Exam Vitals and nursing note reviewed.  Constitutional:  General: He is not in acute distress.    Appearance: Normal appearance. He is well-developed. He is not ill-appearing.  HENT:     Head: Normocephalic and atraumatic.     Right Ear: External ear normal.     Left Ear: External ear normal.     Nose: Nose normal.     Mouth/Throat:     Mouth: Mucous membranes are moist.  Eyes:     General: No scleral icterus.       Right eye: No discharge.        Left eye: No discharge.  Cardiovascular:     Rate and Rhythm: Normal rate.  Pulmonary:     Effort: Pulmonary effort is normal. No respiratory distress.     Breath sounds: No stridor.  Abdominal:     General: Abdomen is flat. There is no distension.     Tenderness: There is no guarding.  Musculoskeletal:        General: No deformity.     Cervical back: No rigidity.       Legs:     Comments: No sig pain with ROM testing and provocative testing of right knee  There is some pain w/ palpation of the semitendinosus muscle  LE NVI b/l Strength 5/5 BLLE  No midline ttp w/ percussion or palpitation, no stepoff or crepitance   Skin:    General: Skin is warm and dry.     Coloration: Skin is not cyanotic, jaundiced or pale.  Neurological:     Mental Status: He is alert.  Psychiatric:        Speech: Speech normal.        Behavior: Behavior  normal. Behavior is cooperative.     ED Results and Treatments Labs (all labs ordered are listed, but only abnormal results are displayed) Labs Reviewed - No data to display                                                                                                                        Radiology No results found.  Pertinent labs & imaging results that were available during my care of the patient were reviewed by me and considered in my medical decision making (see MDM for details).  Medications Ordered in ED Medications  ketorolac  (TORADOL ) injection 30 mg (has no administration in time range)  lidocaine  (LIDODERM ) 5 % 1 patch (has no administration in time range)  acetaminophen  (TYLENOL ) tablet 1,000 mg (has no administration in time range)  Procedures Procedures  (including critical care time)  Medical Decision Making / ED Course    Medical Decision Making:    [name] is a 33 y.o. male with past medical history as below, significant for chronic back pain, DDD lumbar, knee pain who presents to the ED with complaint of low back pain. The complaint involves an extensive differential diagnosis and also carries with it a high risk of complications and morbidity.  Serious etiology was considered. Ddx includes but is not limited to: Differential diagnosis includes but is not exclusive to musculoskeletal back pain, renal colic, urinary tract infection, pyelonephritis, intra-abdominal causes of back pain, aortic aneurysm or dissection, cauda equina syndrome, sciatica, lumbar disc disease, thoracic disc disease, etc.   Complete initial physical exam performed, notably the patient was in NAD.    Reviewed and confirmed nursing documentation for past medical history, family history, social history.  Vital signs reviewed.    Low back pain Posterior  right thigh pain > - no sig pain to the knee, no pain with ROM testing and provocative testing of the right knee, does feel his leg muscles feel tight or spasm with extension of the RLE - favor combination of sciatica, possible tendonitis or muscle spasm. There is some pain at the semitendinosus/muscle spasm - multi modal pain control for home - feeling better after intervention in the ED - close f/u w/ ortho and pcp  Patient presents with low back pain without signs of spinal cord compression, cauda equina syndrome, infection, aneurysm, or other serious etiology. The patient is neurologically intact. Given the extremely low risk of these diagnoses further testing and evaluation for these possibilities does not appear to be indicated at this time. Detailed discussions were had with the patient and/or family and caregivers, regarding current findings, and need for close f/u outpatient.     3:30 PM:  I have discussed the diagnosis/risks/treatment options with the patient.  Evaluation and diagnostic testing in the emergency department does not suggest an emergent condition requiring admission or immediate intervention beyond what has been performed at this time.  They will follow up with PCP/ortho. We also discussed returning to the ED immediately if new or worsening sx occur. We discussed the sx which are most concerning (e.g., sudden worsening pain, fever, inability to tolerate by mouth) that necessitate immediate return.    The patient appears reasonably screened and/or stabilized for discharge and I doubt any other medical condition or other [facility] requiring further screening, evaluation, or treatment in the ED at this time prior to discharge.                 Additional history obtained: -Additional history obtained from na -External records from outside source obtained and reviewed including: Chart review including previous notes, labs, imaging, consultation notes including  Recent  orthopedic documentation, PDMP   Lab Tests: Not applicable EKG   EKG Interpretation Date/Time:    Ventricular Rate:    PR Interval:    QRS Duration:    QT Interval:    QTC Calculation:   R Axis:      Text Interpretation:           Imaging Studies ordered: Not applicable  Medicines ordered and prescription drug management: Meds ordered this encounter  Medications   ketorolac  (TORADOL ) injection 30 mg   lidocaine  (LIDODERM ) 5 % 1 patch   acetaminophen  (TYLENOL ) tablet 1,000 mg    -I have reviewed the patients home medicines and have made adjustments as  needed   Consultations Obtained: Not applicable  Cardiac Monitoring: Continuous pulse oximetry interpreted by myself, 100% on RA.    Social Determinants of Health:  Diagnosis or treatment significantly limited by social determinants of health: current smoker and obesity   Reevaluation: After the interventions noted above, I reevaluated the patient and found that they have improved  Co morbidities that complicate the patient evaluation  Past Medical History:  Diagnosis Date   Bronchitis    Chronic back pain    Degenerative disc disease, lumbar    Upper respiratory infection, acute       Dispostion: Disposition decision including need for hospitalization was considered, and patient discharged from emergency department.    Final Clinical Impression(s) / ED Diagnoses Final diagnoses:  Muscle strain  Tendinitis of right knee         [1]  Social History Tobacco Use   Smoking status: Every Day    Current packs/day: 1.50    Types: Cigarettes   Smokeless tobacco: Never  Vaping Use   Vaping status: Never Used  Substance Use Topics   Alcohol use: Yes   Drug use: No     Elnor Jayson LABOR, DO 04/27/24 1530  "
